# Patient Record
Sex: Male | Born: 1995 | Race: Black or African American | Hispanic: No | Marital: Single | Smoking: Former smoker
Health system: Southern US, Community
[De-identification: ages and names within clinical notes are randomized; demographics above are authoritative.]

## PROBLEM LIST (undated history)

## (undated) ENCOUNTER — Emergency Department: Admission: EM | Payer: PRIVATE HEALTH INSURANCE | Source: Home / Self Care

## (undated) DIAGNOSIS — N200 Calculus of kidney: Secondary | ICD-10-CM

## (undated) DIAGNOSIS — Z720 Tobacco use: Secondary | ICD-10-CM

## (undated) DIAGNOSIS — I301 Infective pericarditis: Secondary | ICD-10-CM

## (undated) DIAGNOSIS — R079 Chest pain, unspecified: Secondary | ICD-10-CM

## (undated) HISTORY — DX: Infective pericarditis: I30.1

## (undated) HISTORY — PX: ANTERIOR CRUCIATE LIGAMENT REPAIR: SHX115

## (undated) HISTORY — DX: Chest pain, unspecified: R07.9

## (undated) HISTORY — DX: Tobacco use: Z72.0

## (undated) HISTORY — DX: Calculus of kidney: N20.0

---
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
## 2014-01-26 ENCOUNTER — Emergency Department: Payer: Self-pay | Admitting: Emergency Medicine

## 2014-06-26 ENCOUNTER — Emergency Department: Payer: Self-pay | Admitting: Emergency Medicine

## 2014-06-26 LAB — COMPREHENSIVE METABOLIC PANEL
Albumin: 3.8 g/dL (ref 3.8–5.6)
Alkaline Phosphatase: 76 U/L
Anion Gap: 5 — ABNORMAL LOW (ref 7–16)
BILIRUBIN TOTAL: 0.3 mg/dL (ref 0.2–1.0)
BUN: 12 mg/dL (ref 7–18)
CALCIUM: 8.8 mg/dL — AB (ref 9.0–10.7)
CO2: 28 mmol/L (ref 21–32)
Chloride: 107 mmol/L (ref 98–107)
Creatinine: 1.37 mg/dL — ABNORMAL HIGH (ref 0.60–1.30)
EGFR (Non-African Amer.): >60
Glucose: 101 mg/dL — ABNORMAL HIGH (ref 65–99)
OSMOLALITY: 279 (ref 275–301)
Potassium: 4.2 mmol/L (ref 3.5–5.1)
SGOT(AST): 82 U/L — ABNORMAL HIGH (ref 10–41)
SGPT (ALT): 33 U/L (ref 12–78)
SODIUM: 140 mmol/L (ref 136–145)
Total Protein: 6.8 g/dL (ref 6.4–8.6)

## 2014-06-26 LAB — URINALYSIS, COMPLETE
BACTERIA: NONE SEEN
Bilirubin,UR: NEGATIVE
GLUCOSE, UR: NEGATIVE mg/dL (ref 0–75)
Ketone: NEGATIVE
Nitrite: NEGATIVE
PH: 5 (ref 4.5–8.0)
Protein: NEGATIVE
Specific Gravity: 1.019 (ref 1.003–1.030)
WBC UR: 96 /HPF (ref 0–5)

## 2014-06-26 LAB — CBC
HCT: 43 % (ref 40.0–52.0)
HGB: 14.7 g/dL (ref 13.0–18.0)
MCH: 31.7 pg (ref 26.0–34.0)
MCHC: 34.2 g/dL (ref 32.0–36.0)
MCV: 93 fL (ref 80–100)
PLATELETS: 232 10*3/uL (ref 150–440)
RBC: 4.63 10*6/uL (ref 4.40–5.90)
RDW: 13.2 % (ref 11.5–14.5)
WBC: 7.8 10*3/uL (ref 3.8–10.6)

## 2014-06-28 LAB — URINE CULTURE

## 2016-05-17 ENCOUNTER — Other Ambulatory Visit
Admission: RE | Admit: 2016-05-17 | Payer: Worker's Compensation | Source: Other Acute Inpatient Hospital | Admitting: Family Medicine

## 2016-06-21 ENCOUNTER — Encounter: Payer: Self-pay | Admitting: Emergency Medicine

## 2016-06-21 ENCOUNTER — Emergency Department
Admission: EM | Admit: 2016-06-21 | Discharge: 2016-06-21 | Disposition: A | Discharge status: Home / Self Care | Payer: Self-pay | Attending: Emergency Medicine | Admitting: Emergency Medicine

## 2016-06-21 DIAGNOSIS — J988 Other specified respiratory disorders: Secondary | ICD-10-CM

## 2016-06-21 DIAGNOSIS — R300 Dysuria: Secondary | ICD-10-CM | POA: Insufficient documentation

## 2016-06-21 DIAGNOSIS — B9789 Other viral agents as the cause of diseases classified elsewhere: Secondary | ICD-10-CM

## 2016-06-21 DIAGNOSIS — J069 Acute upper respiratory infection, unspecified: Secondary | ICD-10-CM | POA: Insufficient documentation

## 2016-06-21 LAB — CHLAMYDIA/NGC RT PCR (ARMC ONLY)
CHLAMYDIA TR: NOT DETECTED
N gonorrhoeae: NOT DETECTED

## 2016-06-21 LAB — URINALYSIS COMPLETE WITH MICROSCOPIC (ARMC ONLY)
BACTERIA UA: NONE SEEN
Bilirubin Urine: NEGATIVE
GLUCOSE, UA: NEGATIVE mg/dL
HGB URINE DIPSTICK: NEGATIVE
Ketones, ur: NEGATIVE mg/dL
LEUKOCYTES UA: NEGATIVE
NITRITE: NEGATIVE
PROTEIN: NEGATIVE mg/dL
RBC / HPF: NONE SEEN RBC/hpf (ref 0–5)
SPECIFIC GRAVITY, URINE: 1.019 (ref 1.005–1.030)
pH: 6 (ref 5.0–8.0)

## 2016-06-21 MED ORDER — PSEUDOEPH-BROMPHEN-DM 30-2-10 MG/5ML PO SYRP: 5.0000 mL | ORAL_SOLUTION | Freq: Three times a day (TID) | ORAL | Status: DC | PRN

## 2016-06-21 NOTE — ED Provider Notes (Signed)
CSN: [id number]     Arrival date & time 06/21/16  1809 History   First MD Initiated Contact with Patient 06/21/16 1849     Chief Complaint  Patient presents with  . Otalgia     (Consider location/radiation/quality/duration/timing/severity/associated sxs/prior Treatment) HPI  20 year old male presents to emergency department for evaluation of right ear pain 1 week. Pain initially improved from last 2 days has increased. Patient is taking ibuprofen without improvement. He complains of associated sore throat, cough, congestion. He denies any fevers, difficulty swallowing, chest pain, shortness of breath, abdominal pain, nausea vomiting or skin rashes. Patient also complains of dysuria that has been present for 7 days. He denies any testicular pain or penile discharge. He is concerned he may have a STD, has had numerous sexual partners of the last month. No known exposure to STD.   No past medical history on file. History reviewed. No pertinent past surgical history. No family history on file. Social History  Substance Use Topics  . Smoking status: Never Smoker   . Smokeless tobacco: None  . Alcohol Use: None    Review of Systems  Constitutional: Negative.  Negative for fever, chills, activity change and appetite change.  HENT: Positive for congestion, ear pain, rhinorrhea and sinus pressure. Negative for ear discharge, mouth sores, sore throat and trouble swallowing.   Eyes: Negative for photophobia, pain and discharge.  Respiratory: Negative for cough, chest tightness and shortness of breath.   Cardiovascular: Negative for chest pain and leg swelling.  Gastrointestinal: Negative for nausea, vomiting, abdominal pain, diarrhea and abdominal distention.  Genitourinary: Positive for dysuria. Negative for urgency, decreased urine volume, discharge, scrotal swelling, difficulty urinating, penile pain and testicular pain.  Musculoskeletal: Negative for back pain, arthralgias and gait problem.   Skin: Negative for color change and rash.  Neurological: Negative for dizziness and headaches.  Hematological: Negative for adenopathy.  Psychiatric/Behavioral: Negative for behavioral problems and agitation.      Allergies  Review of patient's allergies indicates no known allergies.  Home Medications   Prior to Admission medications   Medication Sig Start Date End Date Taking? Authorizing Provider  brompheniramine-pseudoephedrine-DM 30-2-10 MG/5ML syrup Take 5 mLs by mouth 3 (three) times daily as needed. 06/21/16   Evon Slackhomas C Gaines, PA-C   BP 127/64 mmHg  Pulse 64  Temp(Src) 98.5 F (36.9 C) (Oral)  Resp 15  Ht 6\' 2"  (1.88 m)  Wt 99.791 kg  BMI 28.23 kg/m2  SpO2 100% Physical Exam  Constitutional: He is oriented to person, place, and time. He appears well-developed and well-nourished.  HENT:  Head: Normocephalic and atraumatic.  Right Ear: External ear normal.  Left Ear: External ear normal.  Nose: Nose normal.  Mouth/Throat: Oropharynx is clear and moist. No oropharyngeal exudate.  Eyes: Conjunctivae and EOM are normal. Pupils are equal, round, and reactive to light.  Neck: Normal range of motion. Neck supple.  Cardiovascular: Normal rate, regular rhythm, normal heart sounds and intact distal pulses.   Pulmonary/Chest: Effort normal and breath sounds normal. No respiratory distress. He has no wheezes. He has no rales. He exhibits no tenderness.  Abdominal: Soft. Bowel sounds are normal. He exhibits no distension. There is no tenderness.  Genitourinary: Right testis shows no mass, no swelling and no tenderness. Right testis is descended. Left testis shows no mass, no swelling and no tenderness. Left testis is descended. Uncircumcised. No penile erythema. No discharge found.  Musculoskeletal: Normal range of motion. He exhibits no edema or tenderness.  Lymphadenopathy:  He has cervical adenopathy (mild posterior cervical lymphadenopathy).  Neurological: He is alert and  oriented to person, place, and time.  Skin: Skin is warm and dry.  Psychiatric: He has a normal mood and affect. His behavior is normal. Judgment and thought content normal.    ED Course  Procedures (including critical care time) Labs Review Labs Reviewed  URINALYSIS COMPLETEWITH MICROSCOPIC ([facility] ONLY) - Abnormal; Notable for the following:    Color, Urine YELLOW (*)    APPearance CLEAR (*)    Squamous Epithelial / LPF 0-5 (*)    All other components within normal limits  CHLAMYDIA/NGC RT PCR [facility] ONLY)    Imaging Review No results found. I have personally reviewed and evaluated these images and lab results as part of my medical decision-making.   EKG Interpretation None      MDM   Final diagnoses:  Viral respiratory illness  Dysuria    20 year old male with right ear pain, mild cold symptoms. Vital signs are normal. Patient's given Bromfed-DM medication with antihistamine and decongestant to help with congestion. Patient also with dysuria, no penile discharge, penile and scrotal exam normal. Urinalysis normal. Patient is tested for STD, labs have not resulted, if positive patient will need to come back for treatment.    Evon Slackhomas C Gaines, PA-C 06/21/16 2016  Loleta Roseory Forbach, MD 06/21/16 2229

## 2016-06-21 NOTE — Discharge Instructions (Signed)
Dysuria Dysuria is pain or discomfort while urinating. The pain or discomfort may be felt in the tube that carries urine out of the bladder (urethra) or in the surrounding tissue of the genitals. The pain may also be felt in the groin area, lower abdomen, and lower back. You may have to urinate frequently or have the sudden feeling that you have to urinate (urgency). Dysuria can affect both men and women, but is more common in women. Dysuria can be caused by many different things, including:  Urinary tract infection in women.  Infection of the kidney or bladder.  Kidney stones or bladder stones.  Certain sexually transmitted infections (STIs), such as chlamydia.  Dehydration.  Inflammation of the vagina.  Use of certain medicines.  Use of certain soaps or scented products that cause irritation. HOME CARE INSTRUCTIONS Watch your dysuria for any changes. The following actions may help to reduce any discomfort you are feeling:  Drink enough fluid to keep your urine clear or pale yellow.  Empty your bladder often. Avoid holding urine for long periods of time.  After a bowel movement or urination, women should cleanse from front to back, using each tissue only once.  Empty your bladder after sexual intercourse.  Take medicines only as directed by your health care provider.  If you were prescribed an antibiotic medicine, finish it all even if you start to feel better.  Avoid caffeine, tea, and alcohol. They can irritate the bladder and make dysuria worse. In men, alcohol may irritate the prostate.  Keep all follow-up visits as directed by your health care provider. This is important.  If you had any tests done to find the cause of dysuria, it is your responsibility to obtain your test results. Ask the lab or department performing the test when and how you will get your results. Talk with your health care provider if you have any questions about your results. SEEK MEDICAL CARE  IF:  You develop pain in your back or sides.  You have a fever.  You have nausea or vomiting.  You have blood in your urine.  You are not urinating as often as you usually do. SEEK IMMEDIATE MEDICAL CARE IF:  You pain is severe and not relieved with medicines.  You are unable to hold down any fluids.  You or someone else notices a change in your mental function.  You have a rapid heartbeat at rest.  You have shaking or chills.  You feel extremely weak.   This information is not intended to replace advice given to you by your health care provider. Make sure you discuss any questions you have with your health care provider.   Document Released: 09/14/2004 Document Revised: 01/07/2015 Document Reviewed: 08/12/2014 Elsevier Interactive Patient Education 2016 Elsevier Inc.  Viral Infections A virus is a type of germ. Viruses can cause:  Minor sore throats.  Aches and pains.  Headaches.  Runny nose.  Rashes.  Watery eyes.  Tiredness.  Coughs.  Loss of appetite.  Feeling sick to your stomach (nausea).  Throwing up (vomiting).  Watery poop (diarrhea). HOME CARE   Only take medicines as told by your doctor.  Drink enough water and fluids to keep your pee (urine) clear or pale yellow. Sports drinks are a good choice.  Get plenty of rest and eat healthy. Soups and broths with crackers or rice are fine. GET HELP RIGHT AWAY IF:   You have a very bad headache.  You have shortness of breath.  You have chest pain or neck pain.  You have an unusual rash.  You cannot stop throwing up.  You have watery poop that does not stop.  You cannot keep fluids down.  You or your child has a temperature by mouth above 102 F (38.9 C), not controlled by medicine.  Your baby is older than 3 months with a rectal temperature of 102 F (38.9 C) or higher.  Your baby is 193 months old or younger with a rectal temperature of 100.4 F (38 C) or higher. MAKE SURE YOU:    Understand these instructions.  Will watch this condition.  Will get help right away if you are not doing well or get worse.   This information is not intended to replace advice given to you by your health care provider. Make sure you discuss any questions you have with your health care provider.   Document Released: 11/29/2008 Document Revised: 03/10/2012 Document Reviewed: 05/25/2015 Elsevier Interactive Patient Education [employer]2016 Elsevier Inc.

## 2016-06-21 NOTE — ED Notes (Signed)
C/o right ear pain x 1 week 

## 2016-06-21 NOTE — ED Notes (Signed)
Discharge instructions reviewed with patient. Patient verbalized understanding. Patient ambulated to lobby without difficulty.   

## 2016-06-28 ENCOUNTER — Emergency Department (HOSPITAL_COMMUNITY)
Admission: EM | Admit: 2016-06-28 | Discharge: 2016-06-28 | Disposition: A | Discharge status: Home / Self Care | Payer: 59 | Attending: Emergency Medicine | Admitting: Emergency Medicine

## 2016-06-28 ENCOUNTER — Encounter (HOSPITAL_COMMUNITY): Payer: Self-pay | Admitting: Emergency Medicine

## 2016-06-28 DIAGNOSIS — F17210 Nicotine dependence, cigarettes, uncomplicated: Secondary | ICD-10-CM | POA: Insufficient documentation

## 2016-06-28 DIAGNOSIS — J028 Acute pharyngitis due to other specified organisms: Secondary | ICD-10-CM | POA: Diagnosis not present

## 2016-06-28 DIAGNOSIS — J029 Acute pharyngitis, unspecified: Secondary | ICD-10-CM

## 2016-06-28 DIAGNOSIS — B9789 Other viral agents as the cause of diseases classified elsewhere: Secondary | ICD-10-CM | POA: Diagnosis not present

## 2016-06-28 LAB — RAPID STREP SCREEN (MED CTR MEBANE ONLY): STREPTOCOCCUS, GROUP A SCREEN (DIRECT): NEGATIVE

## 2016-06-28 NOTE — ED Provider Notes (Signed)
CSN: [id number]     Arrival date & time 06/28/16  [id number] History  By signing my name below, I, Tony Pitts, attest that this documentation has been prepared under the direction and in the presence of [name], PA-C. Electronically Signed: Doreatha MartinEva Pitts, ED Scribe. 06/28/2016. 10:27 AM.     Chief Complaint  Patient presents with  . Sore Throat   The history is provided by the patient. No language interpreter was used.   HPI Comments: Tony Pitts is a 20 y.o. male who presents to the Emergency Department complaining of moderate left-sided sore throat onset onset 4 days ago with associated generalized myalgias, rhinorrhea, congestion. He reports his pain began initially last week with onset of ear pain, for which he was treated with Bromfed-DM. He reports that his ear pain has resolved after this treatment, but his sore throat has persisted. Per pt, he has tried Chloraseptic spray and throat lozenges with no relief of pain. Pt states that pain is worsened with swallowing. Pt reports adequate food and fluid intake. No known sick contacts with similar symptoms. Denies cough, fever, chills, nausea, emesis, diarrhea, difficulty tolerating secretions or swallowing.    History reviewed. No pertinent past medical history. History reviewed. No pertinent past surgical history. No family history on file. Social History  Substance Use Topics  . Smoking status: Current Every Day Smoker -- 0.10 packs/day    Types: Cigarettes  . Smokeless tobacco: None  . Alcohol Use: Yes     Comment: socially    Review of Systems  Constitutional: Negative for fever and chills.  HENT: Positive for congestion, rhinorrhea and sore throat. Negative for trouble swallowing.   Respiratory: Negative for cough.   Gastrointestinal: Negative for nausea, vomiting and diarrhea.  Musculoskeletal: Positive for myalgias (generalized).   Allergies  Review of patient's allergies indicates no known allergies.  Home  Medications   Prior to Admission medications   Medication Sig Start Date End Date Taking? Authorizing Provider  brompheniramine-pseudoephedrine-DM 30-2-10 MG/5ML syrup Take 5 mLs by mouth 3 (three) times daily as needed. 06/21/16   Evon Slackhomas C Gaines, PA-C   BP 128/67 mmHg  Pulse 68  Temp(Src) 98.4 F (36.9 C) (Oral)  Resp 16  Ht 6\' 2"  (1.88 m)  Wt 235 lb (106.595 kg)  BMI 30.16 kg/m2  SpO2 100% Physical Exam  Physical Exam  Constitutional: Pt  is oriented to person, place, and time. Appears well-developed and well-nourished. No distress.  HENT:  Head: Normocephalic and atraumatic.  Right Ear: Tympanic membrane, external ear and ear canal normal.  Left Ear: Tympanic membrane, external ear and ear canal normal.  Nose: No epistaxis. Right sinus exhibits no maxillary sinus tenderness and no frontal sinus tenderness. Left sinus exhibits no maxillary sinus tenderness and no frontal sinus tenderness.  Mouth/Throat: Uvula is midline and mucous membranes are normal. Mucous membranes are not pale and not cyanotic. No tonsillar abscesses. Moderate posterior oropharyngeal edema with mild exudate.  Eyes: Conjunctivae are normal. Pupils are equal, round, and reactive to light.  Neck: Normal range of motion and full passive range of motion without pain.  Cardiovascular: Normal rate and intact distal pulses.   Pulmonary/Chest: Effort normal and breath sounds normal. No stridor.  Clear and equal breath sounds without focal wheezes, rhonchi, rales  Abdominal: Soft. Bowel sounds are normal. There is no tenderness.  Musculoskeletal: Normal range of motion.  Lymphadenopathy:    Pthas no cervical adenopathy.  Neurological: Pt is alert and oriented to person, place, and  time.  Skin: Skin is warm and dry. No rash noted. Pt is not diaphoretic.  Psychiatric: Normal mood and affect.  Nursing note and vitals reviewed.   ED Course  Procedures (including critical care time) DIAGNOSTIC STUDIES: Oxygen  Saturation is 100% on RA, normal by my interpretation.    COORDINATION OF CARE: 10:24 AM Discussed treatment plan with pt at bedside which includes rapid strep and pt agreed to plan. Discussed conservative therapies if rapid strep is negative, including alternating tylenol and ibuprofen, increased fluid intake.    Labs Review Labs Reviewed  RAPID STREP SCREEN (NOT AT [facility])  CULTURE, GROUP A STREP [facility])    I have personally reviewed and evaluated these lab results as part of my medical decision-making.   MDM   Final diagnoses:  Viral pharyngitis    Pt is requesting to be d/c without results, and asks that he can be notified by telephone if he needs an antibiotic. We will f/u on rapid strep test and notify him if positive.   Step test is negative.  I personally performed the services described in this documentation, which was scribed in my presence. The recorded information has been reviewed and is accurate.     Roxy HorsemanRobert Insiya Oshea, PA-C 06/28/16 1106  Marily MemosJason Mesner, MD 06/28/16 [id number]

## 2016-06-28 NOTE — Discharge Instructions (Signed)

## 2016-06-28 NOTE — ED Notes (Signed)
Patient states sore throat x several days.   Patient states has been using chloraseptic spray and throat lozenges at home with no relief.   Denies other symptoms.

## 2016-06-29 LAB — CULTURE, GROUP A STREP (THRC)

## 2016-08-22 ENCOUNTER — Emergency Department (HOSPITAL_COMMUNITY)
Admission: EM | Admit: 2016-08-22 | Discharge: 2016-08-22 | Disposition: A | Discharge status: Home / Self Care | Payer: 59 | Attending: Emergency Medicine | Admitting: Emergency Medicine

## 2016-08-22 ENCOUNTER — Encounter (HOSPITAL_COMMUNITY): Payer: Self-pay | Admitting: Emergency Medicine

## 2016-08-22 DIAGNOSIS — L709 Acne, unspecified: Secondary | ICD-10-CM | POA: Diagnosis not present

## 2016-08-22 DIAGNOSIS — R21 Rash and other nonspecific skin eruption: Secondary | ICD-10-CM | POA: Diagnosis present

## 2016-08-22 DIAGNOSIS — F17210 Nicotine dependence, cigarettes, uncomplicated: Secondary | ICD-10-CM | POA: Insufficient documentation

## 2016-08-22 MED ORDER — SULFACETAMIDE SODIUM-SULFUR 10-5 % EX CREA: TOPICAL_CREAM | CUTANEOUS | 0 refills | Status: DC

## 2016-08-22 NOTE — ED Triage Notes (Signed)
Patient complaining of rash around mouth area x 2 months. Denies pain or itching.

## 2016-08-24 NOTE — ED Provider Notes (Signed)
[facility]-EMERGENCY DEPT Provider Note   CSN: [id number] Arrival date & time: 08/22/16  1534     History   Chief Complaint Chief Complaint  Patient presents with  . Rash    HPI Tony Pitts is a 20 y.o. male presenting with a rash which has been present around his mouth for the past 2 months.  He describes tiny white topped "bumps" which are neither painful or itchy.  He denies drainage from the sites but does state they seem to be spreading as he noticed a few new ones around the upper left lip border.  He has had no treatments prior to arrival for this problem.  The history is provided by the patient.    History reviewed. No pertinent past medical history.  There are no active problems to display for this patient.   Past Surgical History:  Procedure Laterality Date  . ANTERIOR CRUCIATE LIGAMENT REPAIR         Home Medications    Prior to Admission medications   Medication Sig Start Date End Date Taking? Authorizing Provider  acetaminophen (TYLENOL) 500 MG tablet Take 1,000 mg by mouth every 6 (six) hours as needed.   Yes Historical Provider, MD  brompheniramine-pseudoephedrine-DM 30-2-10 MG/5ML syrup Take 5 mLs by mouth 3 (three) times daily as needed. Patient not taking: Reported on 08/22/2016 06/21/16   Evon Slack, PA-C  Sulfacetamide Sodium-Sulfur 10-5 % CREA Apply small amount to affected area twice daily, rubbing in until absorbed 08/22/16   Burgess Amor, PA-C    Family History History reviewed. No pertinent family history.  Social History Social History  Substance Use Topics  . Smoking status: Current Every Day Smoker    Packs/day: 0.10    Types: Cigarettes  . Smokeless tobacco: Never Used  . Alcohol use Yes     Comment: socially     Allergies   Review of patient's allergies indicates no known allergies.   Review of Systems Review of Systems  Constitutional: Negative for chills and fever.  Respiratory: Negative for shortness of breath and  wheezing.   Skin: Positive for rash.  Neurological: Negative for numbness.     Physical Exam Updated Vital Signs BP 127/66 (BP Location: Left Arm)   Pulse 80   Temp 98.2 F (36.8 C) (Oral)   Resp 14   Ht 6\' 2"  (1.88 m)   Wt 89.8 kg   SpO2 100%   BMI 25.42 kg/m   Physical Exam  Constitutional: He appears well-developed and well-nourished. No distress.  HENT:  Head: Normocephalic.  Neck: Neck supple.  Cardiovascular: Normal rate.   Pulmonary/Chest: Effort normal. He has no wheezes.  Musculoskeletal: Normal range of motion. He exhibits no edema.  Skin: Rash noted.  Pt has tiny raised white centered papules on left chin and left lip border.  No edema or surrounding erythema.  Dry.      ED Treatments / Results  Labs (all labs ordered are listed, but only abnormal results are displayed) Labs Reviewed - No data to display  EKG  EKG Interpretation None       Radiology No results found.  Procedures Procedures (including critical care time)  Medications Ordered in ED Medications - No data to display   Initial Impression / Assessment and Plan / ED Course  I have reviewed the triage vital signs and the nursing notes.  Pertinent labs & imaging results that were available during my care of the patient were reviewed by me and considered in  my medical decision making (see chart for details).  Clinical Course    Exam c/w blocked pores/small comedones without infection.  Advised sulfacetamide cream bid. Referral to dermatology for recheck if not improving after 10 days use of this medicine.   Final Clinical Impressions(s) / ED Diagnoses   Final diagnoses:  Acne, unspecified acne type    New Prescriptions Discharge Medication List as of 08/22/2016  4:42 PM    START taking these medications   Details  Sulfacetamide Sodium-Sulfur 10-5 % CREA Apply small amount to affected area twice daily, rubbing in until absorbed, Print         Burgess AmorJulie Syd Newsome, PA-C 08/24/16  1416    Vanetta MuldersScott Zackowski, MD 08/29/16 [phone]

## 2016-09-16 ENCOUNTER — Encounter (HOSPITAL_COMMUNITY): Payer: Self-pay | Admitting: *Deleted

## 2016-09-16 ENCOUNTER — Emergency Department (HOSPITAL_COMMUNITY): Payer: 59

## 2016-09-16 ENCOUNTER — Emergency Department (HOSPITAL_COMMUNITY)
Admission: EM | Admit: 2016-09-16 | Discharge: 2016-09-16 | Disposition: A | Discharge status: Home / Self Care | Payer: 59 | Attending: Emergency Medicine | Admitting: Emergency Medicine

## 2016-09-16 DIAGNOSIS — B9789 Other viral agents as the cause of diseases classified elsewhere: Secondary | ICD-10-CM

## 2016-09-16 DIAGNOSIS — J069 Acute upper respiratory infection, unspecified: Secondary | ICD-10-CM | POA: Diagnosis not present

## 2016-09-16 DIAGNOSIS — F17210 Nicotine dependence, cigarettes, uncomplicated: Secondary | ICD-10-CM | POA: Diagnosis not present

## 2016-09-16 DIAGNOSIS — R079 Chest pain, unspecified: Secondary | ICD-10-CM | POA: Diagnosis present

## 2016-09-16 LAB — I-STAT CHEM 8, ED
BUN: 15 mg/dL (ref 6–20)
Calcium, Ion: 1.14 mmol/L — ABNORMAL LOW (ref 1.15–1.40)
Chloride: 102 mmol/L (ref 101–111)
Creatinine, Ser: 1.4 mg/dL — ABNORMAL HIGH (ref 0.61–1.24)
GLUCOSE: 84 mg/dL (ref 65–99)
HCT: 48 % (ref 39.0–52.0)
HEMOGLOBIN: 16.3 g/dL (ref 13.0–17.0)
POTASSIUM: 4.4 mmol/L (ref 3.5–5.1)
Sodium: 141 mmol/L (ref 135–145)
TCO2: 26 mmol/L (ref 0–100)

## 2016-09-16 LAB — I-STAT TROPONIN, ED: TROPONIN I, POC: 0 ng/mL (ref 0.00–0.08)

## 2016-09-16 MED ORDER — ALBUTEROL SULFATE HFA 108 (90 BASE) MCG/ACT IN AERS
2.0000 | INHALATION_SPRAY | RESPIRATORY_TRACT | Status: DC | PRN
  Administered 2016-09-16: 2 via RESPIRATORY_TRACT
  Filled 2016-09-16: qty 6.7

## 2016-09-16 MED ORDER — AEROCHAMBER PLUS FLO-VU MEDIUM MISC
1.0000 | Freq: Once | Status: AC
  Administered 2016-09-16: 1
  Filled 2016-09-16: qty 1

## 2016-09-16 NOTE — ED Provider Notes (Signed)
[facility]-EMERGENCY DEPT Provider Note   CSN: [id number] Arrival date & time: 09/16/16  0036     History   Chief Complaint Chief Complaint  Patient presents with  . Chest Pain    HPI Tony Pitts is a 20 y.o. male with no major medical problems presents to the Emergency Department complaining of intermittent central chest pain rated at a 3/10 onset 3 days ago.  Pt reports episodes last 3-4 minutes and occur multiple times per day.   He does do heavy lifting at work.  Movement and palpation do not aggravate his pain.  He denies cocaine usage.  He reports he is completely pain free.  Pt denies near syncope, diaphoresis, nausea, vomiting.  He reports 1.5 weeks of chest and head cold symptoms.  He is coughing.  He also reports associated wheezing with his chest pain.  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  He reports he is smoking cigarettes, cigars and marijuana daily.     The history is provided by the patient and medical records. No language interpreter was used.    History reviewed. No pertinent past medical history.  There are no active problems to display for this patient.   Past Surgical History:  Procedure Laterality Date  . ANTERIOR CRUCIATE LIGAMENT REPAIR         Home Medications    Prior to Admission medications   Medication Sig Start Date End Date Taking? Authorizing Provider  acetaminophen (TYLENOL) 500 MG tablet Take 1,000 mg by mouth every 6 (six) hours as needed for mild pain.    Yes Historical Provider, MD    Family History No family history on file.  Social History Social History  Substance Use Topics  . Smoking status: Current Every Day Smoker    Packs/day: 0.10    Types: Cigarettes  . Smokeless tobacco: Never Used  . Alcohol use Yes     Comment: socially     Allergies   Review of patient's allergies indicates no known allergies.   Review of Systems Review of Systems  HENT: Positive for  congestion ( resolved), rhinorrhea ( resolved) and sinus pressure ( minimal). Negative for sore throat.   Respiratory: Positive for cough (mild) and wheezing. Negative for chest tightness and shortness of breath.   Cardiovascular: Positive for chest pain.  All other systems reviewed and are negative.    Physical Exam Updated Vital Signs BP (!) 119/24   Pulse (!) 45   Temp 98.1 F (36.7 C)   Resp 15   Ht 6\' 2"  (1.88 m)   Wt 89.8 kg   SpO2 100%   BMI 25.42 kg/m   Physical Exam  Constitutional: He appears well-developed and well-nourished. No distress.  Awake, alert, nontoxic appearance  HENT:  Head: Normocephalic and atraumatic.  Right Ear: Tympanic membrane, external ear and ear canal normal.  Left Ear: Tympanic membrane, external ear and ear canal normal.  Nose: Mucosal edema present. No rhinorrhea. No epistaxis. Right sinus exhibits no maxillary sinus tenderness and no frontal sinus tenderness. Left sinus exhibits no maxillary sinus tenderness and no frontal sinus tenderness.  Mouth/Throat: Uvula is midline, oropharynx is clear and moist and mucous membranes are normal. Mucous membranes are not pale and not cyanotic. No oropharyngeal exudate, posterior oropharyngeal edema, posterior oropharyngeal erythema or tonsillar abscesses.  Eyes: Conjunctivae are normal. Pupils are equal, round, and reactive to light. No scleral icterus.  Neck: Normal range of motion and full  passive range of motion without pain. Neck supple.  Cardiovascular: Normal rate, regular rhythm and intact distal pulses.   Pulmonary/Chest: Effort normal. No stridor. No respiratory distress. He has decreased breath sounds ( Somewhat). He has no wheezes.  Equal chest expansion  Abdominal: Soft. Bowel sounds are normal. He exhibits no mass. There is no tenderness. There is no rebound and no guarding.  Musculoskeletal: Normal range of motion. He exhibits no edema.  Lymphadenopathy:    He has no cervical adenopathy.    Neurological: He is alert.  Speech is clear and goal oriented Moves extremities without ataxia  Skin: Skin is warm and dry. No rash noted. He is not diaphoretic.  Psychiatric: He has a normal mood and affect.  Nursing note and vitals reviewed.    ED Treatments / Results  Labs (all labs ordered are listed, but only abnormal results are displayed) Labs Reviewed  I-STAT CHEM 8, ED - Abnormal; Notable for the following:       Result Value   Creatinine, Ser 1.40 (*)    Calcium, Ion 1.14 (*)    All other components within normal limits  I-STAT TROPOININ, ED    EKG  EKG Interpretation  Date/Time:  Sunday September 16 2016 00:37:14 EDT Ventricular Rate:  60 PR Interval:  120 QRS Duration: 102 QT Interval:  372 QTC Calculation: 372 R Axis:   82 Text Interpretation:  Sinus rhythm Normal ECG When compared with ECG of 01/26/2014, Incomplete right bundle branch block is no longer Present Confirmed by [facility]  MD, DAVID ([id number]) on 09/16/2016 12:44:36 AM       Radiology Dg Chest 2 View  Result Date: 09/16/2016 CLINICAL DATA:  Cough, chest pain, shortness of breath for 3 days. EXAM: CHEST  2 VIEW COMPARISON:  01/26/2014 FINDINGS: The cardiomediastinal contours are normal. The lungs are clear. Pulmonary vasculature is normal. No consolidation, pleural effusion, or pneumothorax. No acute osseous abnormalities are seen. IMPRESSION: No active cardiopulmonary disease. Electronically Signed   By: Rubye OaksMelanie  Ehinger M.D.   On: 09/16/2016 02:03    Procedures Procedures (including critical care time)  Medications Ordered in ED Medications  albuterol (PROVENTIL HFA;VENTOLIN HFA) 108 (90 Base) MCG/ACT inhaler 2 puff (2 puffs Inhalation Given 09/16/16 0619)  AEROCHAMBER PLUS FLO-VU MEDIUM MISC 1 each (1 each Other Given 09/16/16 [id number])     Initial Impression / Assessment and Plan / ED Course  I have reviewed the triage vital signs and the nursing notes.  Pertinent labs & imaging results that were  available during my care of the patient were reviewed by me and considered in my medical decision making (see chart for details).  Clinical Course  Value Comment By Time   No family hx of sudden cardiac death.   Dahlia ClientHannah Sorren Vallier, PA-C 09/17 0445  EKG 12-Lead NSR, no ischemia Dierdre ForthHannah Keyonda Bickle, PA-C 09/17 0445  DG Chest 2 View No PNA or pneumothroax.   Dahlia ClientHannah Trenesha Alcaide, PA-C 09/17 0445  Creatinine: (!) 1.40 Creatinine baseline Dierdre ForthHannah Jaia Alonge, PA-C 09/17 0554  Hemoglobin: 16.3 No anemia Dierdre ForthHannah Cassandr Cederberg, PA-C 09/17 0554  Troponin i, poc: 0.00 Neg trop Dierdre ForthHannah Naleyah Ohlinger, PA-C 09/17 [id number]   Patient is to be discharged with recommendation to follow up with PCP in regards to today's hospital visit. Chest pain is not likely of cardiac or pulmonary etiology d/t presentation, PERC negative, VSS, no tracheal deviation, no JVD or new murmur, RRR, breath sounds equal bilaterally, EKG without acute abnormalities, negative troponin, and negative CXR. Pt has been advised to  return to the ED if CP becomes exertional, associated with diaphoresis or nausea, radiates to left jaw/arm, worsens or becomes concerning in any way. Pt appears reliable for follow up and is agreeable to discharge.   Final Clinical Impressions(s) / ED Diagnoses   Final diagnoses:  Chest pain, unspecified chest pain type  Viral URI with cough    New Prescriptions Discharge Medication List as of 09/16/2016  5:53 AM       Dierdre Forth, PA-C 09/16/16 1610    Derwood Kaplan, MD 09/16/16 [id number]

## 2016-09-16 NOTE — ED Triage Notes (Signed)
The pt has seen his doctor in the past 3 days for the same complaint

## 2016-09-16 NOTE — Discharge Instructions (Signed)
1. Medications: albuterol MDI, usual home medications 2. Treatment: rest, drink plenty of fluids, take tylenol or ibuprofen for fever control 3. Follow Up: Please followup with your primary doctor in 3 days for discussion of your diagnoses and further evaluation after today's visit; if you do not have a primary care doctor use the resource guide provided to find one; Return to the ER for high fevers, difficulty breathing or other concerning symptoms

## 2016-09-16 NOTE — ED Triage Notes (Signed)
The pt is c/o mid chest pain for 2-3 days  He has been smoking pot suring this time  He is c/o sob  No visible sob evident

## 2016-11-20 ENCOUNTER — Encounter (HOSPITAL_COMMUNITY): Payer: Self-pay | Admitting: Emergency Medicine

## 2016-11-20 ENCOUNTER — Emergency Department (HOSPITAL_COMMUNITY)
Admission: EM | Admit: 2016-11-20 | Discharge: 2016-11-20 | Disposition: A | Discharge status: Home / Self Care | Payer: 59 | Attending: Emergency Medicine | Admitting: Emergency Medicine

## 2016-11-20 DIAGNOSIS — N50812 Left testicular pain: Secondary | ICD-10-CM | POA: Diagnosis not present

## 2016-11-20 DIAGNOSIS — R300 Dysuria: Secondary | ICD-10-CM | POA: Diagnosis present

## 2016-11-20 DIAGNOSIS — F17210 Nicotine dependence, cigarettes, uncomplicated: Secondary | ICD-10-CM | POA: Insufficient documentation

## 2016-11-20 DIAGNOSIS — N50811 Right testicular pain: Secondary | ICD-10-CM | POA: Insufficient documentation

## 2016-11-20 LAB — URINE MICROSCOPIC-ADD ON: RBC / HPF: NONE SEEN RBC/hpf (ref 0–5)

## 2016-11-20 LAB — URINALYSIS, ROUTINE W REFLEX MICROSCOPIC
Bilirubin Urine: NEGATIVE
GLUCOSE, UA: NEGATIVE mg/dL
HGB URINE DIPSTICK: NEGATIVE
KETONES UR: NEGATIVE mg/dL
Nitrite: NEGATIVE
PH: 7 (ref 5.0–8.0)
PROTEIN: NEGATIVE mg/dL
Specific Gravity, Urine: 1.015 (ref 1.005–1.030)

## 2016-11-20 MED ORDER — LIDOCAINE HCL (PF) 1 % IJ SOLN
INTRAMUSCULAR | Status: AC
  Administered 2016-11-20: 5 mL
  Filled 2016-11-20: qty 5

## 2016-11-20 MED ORDER — CEFTRIAXONE SODIUM 250 MG IJ SOLR
250.0000 mg | Freq: Once | INTRAMUSCULAR | Status: AC
  Administered 2016-11-20: 250 mg via INTRAMUSCULAR
  Filled 2016-11-20: qty 250

## 2016-11-20 MED ORDER — AZITHROMYCIN 250 MG PO TABS
1000.0000 mg | ORAL_TABLET | Freq: Once | ORAL | Status: AC
  Administered 2016-11-20: 1000 mg via ORAL
  Filled 2016-11-20: qty 4

## 2016-11-20 NOTE — ED Triage Notes (Signed)
Pt has pain in testicular area, denies injury, no penile discharge, dysuria.  Pt alert and oriented.  Pt has had unprotected sexual intercourse with a new partner.   Pt alert and oriented. Symptoms started 2 weeks ago.

## 2016-11-20 NOTE — ED Provider Notes (Signed)
[facility] Provider Note   CSN: [id number] Arrival date & time: 11/20/16  1407     History   Chief Complaint Chief Complaint  Patient presents with  . Testicle Pain    HPI Tony Pitts is a 20 y.o. male.  HPI   Tony Pitts is a 20 y.o. male who presents to the Emergency Department complaining of burning with urination and bilateral testicular pain, pressure intermittently for 2 weeks.  He reports having unprotected intercourse with a new sexual partner approximately one week prior to onset of symptoms.  He describes a dull pain to both testicles and burning post void.  He denies penile discharge, genital lesions or rash.  He also denies fever, vomiting, abdominal pain, back pain or hematuria   History reviewed. No pertinent past medical history.  There are no active problems to display for this patient.   Past Surgical History:  Procedure Laterality Date  . ANTERIOR CRUCIATE LIGAMENT REPAIR         Home Medications    Prior to Admission medications   Medication Sig Start Date End Date Taking? Authorizing Provider  acetaminophen (TYLENOL) 500 MG tablet Take 1,000 mg by mouth every 6 (six) hours as needed for mild pain.     Historical Provider, MD    Family History History reviewed. No pertinent family history.  Social History Social History  Substance Use Topics  . Smoking status: Current Every Day Smoker    Packs/day: 0.10    Types: Cigarettes  . Smokeless tobacco: Never Used  . Alcohol use Yes     Comment: socially     Allergies   Patient has no known allergies.   Review of Systems Review of Systems  Constitutional: Negative for activity change, appetite change, chills and fever.  Respiratory: Negative for chest tightness and shortness of breath.   Gastrointestinal: Negative for abdominal pain, nausea and vomiting.  Genitourinary: Positive for dysuria, scrotal swelling and testicular pain. Negative for decreased urine volume,  difficulty urinating, discharge, flank pain, frequency, hematuria, penile pain, penile swelling and urgency.  Musculoskeletal: Negative for back pain.  Skin: Negative for rash.  Neurological: Negative for dizziness, weakness and numbness.  Hematological: Negative for adenopathy.  Psychiatric/Behavioral: Negative for confusion.  All other systems reviewed and are negative.    Physical Exam Updated Vital Signs BP 137/72 (BP Location: Left Arm)   Pulse 89   Temp 98.7 F (37.1 C) (Oral)   Resp 18   Ht 6\' 2"  (1.88 m)   Wt 90.7 kg   SpO2 100%   BMI 25.68 kg/m   Physical Exam  Constitutional: He is oriented to person, place, and time. He appears well-developed and well-nourished. No distress.  HENT:  Head: Normocephalic.  Mouth/Throat: Oropharynx is clear and moist.  Cardiovascular: Normal rate and regular rhythm.   Pulmonary/Chest: Effort normal. No respiratory distress.  Abdominal: Soft. He exhibits no distension and no mass. There is no tenderness. There is no guarding. Hernia confirmed negative in the right inguinal area and confirmed negative in the left inguinal area.  Genitourinary: Testes normal. Cremasteric reflex is present. Right testis shows no mass, no swelling and no tenderness. Left testis shows no mass, no swelling and no tenderness. Circumcised. No phimosis or paraphimosis. No discharge found.  Genitourinary Comments: Exam chaperoned by nursing.  No obvious penile discharge,  Testes are non-tender on exam, no erythema, cord non-tender.  No scrotal edema.  No genital lesions  Musculoskeletal: Normal range of motion.  Lymphadenopathy:  No inguinal adenopathy noted on the right or left side.  Neurological: He is alert and oriented to person, place, and time.  Skin: Skin is warm. No rash noted.  Psychiatric: He has a normal mood and affect.  Nursing note and vitals reviewed.    ED Treatments / Results  Labs (all labs ordered are listed, but only abnormal results are  displayed) Labs Reviewed  URINALYSIS, ROUTINE W REFLEX MICROSCOPIC (NOT AT [facility]) - Abnormal; Notable for the following:       Result Value   Leukocytes, UA TRACE (*)    All other components within normal limits  URINE MICROSCOPIC-ADD ON - Abnormal; Notable for the following:    Squamous Epithelial / LPF 0-5 (*)    Bacteria, UA FEW (*)    All other components within normal limits  URINE CULTURE  RPR  HIV ANTIBODY (ROUTINE TESTING)  GC/CHLAMYDIA PROBE AMP ([place]) NOT AT [facility]    EKG  EKG Interpretation None       Radiology No results found.  Procedures Procedures (including critical care time)  Medications Ordered in ED Medications - No data to display   Initial Impression / Assessment and Plan / ED Course  I have reviewed the triage vital signs and the nursing notes.  Pertinent labs & imaging results that were available during my care of the patient were reviewed by me and considered in my medical decision making (see chart for details).  Clinical Course    Pt well appearing.  recent unprotected intercourse with testicular pain and dysuria.  Nml exam of the testicles. I do not feel imaging is warranted at this time. I suspect sx's are  likely related to urethritis and pt treated here with IM rocephin and po zithromax. Return precautions were given.  Questions answered.    The patient appears reasonably screened and/or stabilized for discharge and I doubt any other medical condition or other [facility] requiring further screening, evaluation, or treatment in the ED at this time prior to discharge.    Final Clinical Impressions(s) / ED Diagnoses   Final diagnoses:  Dysuria    New Prescriptions New Prescriptions   No medications on file     Pauline Ausammy Lilyonna Steidle, PA-C 11/21/16 0008    Mancel BaleElliott Wentz, MD 11/21/16 [phone]

## 2016-11-20 NOTE — Discharge Instructions (Signed)
You will be contacted by someone from the hospital if your results are positive and need additional treatment.  Avoid sex for at least one week.  Return here for any worsening symptoms

## 2016-11-21 LAB — GC/CHLAMYDIA PROBE AMP (~~LOC~~) NOT AT ARMC
Chlamydia: NEGATIVE
NEISSERIA GONORRHEA: NEGATIVE

## 2016-11-21 LAB — HIV ANTIBODY (ROUTINE TESTING W REFLEX): HIV SCREEN 4TH GENERATION: NONREACTIVE

## 2016-11-21 LAB — RPR: RPR: NONREACTIVE

## 2016-11-22 LAB — URINE CULTURE: Culture: NO GROWTH

## 2016-12-15 ENCOUNTER — Emergency Department (HOSPITAL_COMMUNITY)
Admission: EM | Admit: 2016-12-15 | Discharge: 2016-12-15 | Disposition: A | Discharge status: Home / Self Care | Payer: 59 | Attending: Emergency Medicine | Admitting: Emergency Medicine

## 2016-12-15 ENCOUNTER — Encounter (HOSPITAL_COMMUNITY): Payer: Self-pay | Admitting: Emergency Medicine

## 2016-12-15 DIAGNOSIS — R21 Rash and other nonspecific skin eruption: Secondary | ICD-10-CM | POA: Diagnosis present

## 2016-12-15 DIAGNOSIS — F17210 Nicotine dependence, cigarettes, uncomplicated: Secondary | ICD-10-CM | POA: Insufficient documentation

## 2016-12-15 DIAGNOSIS — B081 Molluscum contagiosum: Secondary | ICD-10-CM

## 2016-12-15 NOTE — Discharge Instructions (Signed)
Please obtain all of your results from medical records or have your doctors office obtain the results - share them with your doctor - you should be seen at your doctors office in the next 2 days. Call today to arrange your follow up. Take the medications as prescribed. Please review all of the medicines and only take them if you do not have an allergy to them. Please be aware that if you are taking birth control pills, taking other prescriptions, ESPECIALLY ANTIBIOTICS may make the birth control ineffective - if this is the case, either do not engage in sexual activity or use alternative methods of birth control such as condoms until you have finished the medicine and your family doctor says it is OK to restart them. If you are on a blood thinner such as COUMADIN, be aware that any other medicine that you take may cause the coumadin to either work too much, or not enough - you should have your coumadin level rechecked in next 7 days if this is the case.  °?  °It is also a possibility that you have an allergic reaction to any of the medicines that you have been prescribed - Everybody reacts differently to medications and while MOST people have no trouble with most medicines, you may have a reaction such as nausea, vomiting, rash, swelling, shortness of breath. If this is the case, please stop taking the medicine immediately and contact your physician.  °?  °You should return to the ER if you develop severe or worsening symptoms.  ° °[facility] Doctor List ° ° ° °Edward Hawkins MD. Specialty: Pulmonary Disease Contact information: [address]  °[address]  °[place] [address]  °[phone]  ° °Margaret Simpson, MD. Specialty: Family Medicine Contact information: [address]  °[place] [address]  °[phone]  ° °Scott Luking, MD. Specialty: Family Medicine Contact information: [address]  °[address]  °[place] [address]  °[phone]  ° °Tesfaye Fanta, MD Specialty:  Internal Medicine Contact information: [address]  °[place] [address]  °[phone]  ° °Zach Hall, MD. Specialty: Internal Medicine Contact information: [address]  °[place] [address]  °[phone]  ° °Angus Mcinnis, MD. Specialty: Family Medicine Contact information: [address]  °[place]  [place] [address]  °[phone]  ° °Stephen Knowlton, MD. Specialty: Family Medicine Contact information: [address]  °[address]  °[place] [address]  °[phone]  ° °Roy Fagan, MD. Specialty: Internal Medicine Contact information: [address]  °[address]  °[place]  [address]  °[phone]  ° °

## 2016-12-15 NOTE — ED Provider Notes (Signed)
[facility] Provider Note   CSN: [id number] Arrival date & time: 12/15/16  [id number]  By signing my name below, I, Sonum Patel, attest that this documentation has been prepared under the direction and in the presence of Eber HongBrian Bennetta Rudden, MD. Electronically Signed: Sonum Patel, Neurosurgeoncribe. 12/15/16. 12:16 PM.  History   Chief Complaint Chief Complaint  Patient presents with  . Abscess    The history is provided by the patient. No language interpreter was used.     HPI Comments: Tony Pitts is a 20 y.o. male who presents to the Emergency Department complaining of constant lesions below the left lower lip that has been ongoing for the past ~ 6 months. He states the area has become larger and sometimes has associated itching. He denies any other complaints at this time.   History reviewed. No pertinent past medical history.  There are no active problems to display for this patient.   Past Surgical History:  Procedure Laterality Date  . ANTERIOR CRUCIATE LIGAMENT REPAIR         Home Medications    Prior to Admission medications   Not on File    Family History No family history on file.  Social History Social History  Substance Use Topics  . Smoking status: Current Every Day Smoker    Packs/day: 0.10    Types: Cigarettes  . Smokeless tobacco: Never Used  . Alcohol use Yes     Comment: socially     Allergies   Patient has no known allergies.   Review of Systems Review of Systems  Constitutional: Negative for fever.  Skin: Positive for rash.     Physical Exam Updated Vital Signs BP 136/87 (BP Location: Right Arm)   Pulse (!) 56   Temp 98.4 F (36.9 C) (Oral)   Resp 16   Ht 6\' 2"  (1.88 m)   Wt 200 lb (90.7 kg)   SpO2 100%   BMI 25.68 kg/m   Physical Exam  Constitutional: He appears well-developed and well-nourished.  HENT:  Head: Normocephalic and atraumatic.  Eyes: Conjunctivae are normal. Right eye exhibits no discharge. Left eye exhibits  no discharge.  Pulmonary/Chest: Effort normal. No respiratory distress.  Lymphadenopathy:    He has no cervical adenopathy.  Neurological: He is alert. Coordination normal.  Skin: Skin is warm and dry. Rash noted. He is not diaphoretic. No erythema.  3 papular lesions to left face just below to left side of lip  Psychiatric: He has a normal mood and affect.  Nursing note and vitals reviewed.    ED Treatments / Results  DIAGNOSTIC STUDIES: Oxygen Saturation is 100% on RA, normal by my interpretation.    COORDINATION OF CARE: 12:15 PM Discussed treatment plan with pt at bedside and pt agreed to plan.    Labs (all labs ordered are listed, but only abnormal results are displayed) Labs Reviewed - No data to display  Radiology No results found.  Procedures Procedures (including critical care time)  Medications Ordered in ED Medications - No data to display   Initial Impression / Assessment and Plan / ED Course  I have reviewed the triage vital signs and the nursing notes.  Pertinent labs & imaging results that were available during my care of the patient were reviewed by me and considered in my medical decision making (see chart for details).  Clinical Course    Molluscum,  Benign appearance Referred to PCP  Final Clinical Impressions(s) / ED Diagnoses   Final diagnoses:  Molluscum contagiosum    New Prescriptions New Prescriptions   No medications on file   I personally performed the services described in this documentation, which was scribed in my presence. The recorded information has been reviewed and is accurate.      Eber HongBrian Dynastie Knoop, MD 12/15/16 [phone]

## 2016-12-15 NOTE — ED Triage Notes (Signed)
Patient c/o 3 small abscess under lip. Per patient "has been there as long as I can remember but are getting bigger." Patient reports "sqeezing them with purulent drainage but they return." Denies any pain.

## 2016-12-28 ENCOUNTER — Encounter: Payer: Self-pay | Admitting: Family Medicine

## 2016-12-28 ENCOUNTER — Ambulatory Visit (INDEPENDENT_AMBULATORY_CARE_PROVIDER_SITE_OTHER): Payer: 59 | Admitting: Family Medicine

## 2016-12-28 ENCOUNTER — Encounter (INDEPENDENT_AMBULATORY_CARE_PROVIDER_SITE_OTHER): Payer: Self-pay

## 2016-12-28 VITALS — BP 110/66 | HR 74 | Temp 98.2°F | Resp 16 | Ht 74.0 in | Wt 216.0 lb

## 2016-12-28 DIAGNOSIS — Z720 Tobacco use: Secondary | ICD-10-CM

## 2016-12-28 DIAGNOSIS — Z7689 Persons encountering health services in other specified circumstances: Secondary | ICD-10-CM | POA: Diagnosis not present

## 2016-12-28 HISTORY — DX: Tobacco use: Z72.0

## 2016-12-28 NOTE — Progress Notes (Signed)
Chief Complaint  Patient presents with  . Establish Care   Healthy 20 yo No ongoing health issues Refuses flu shot Up to date with immunizations  I have discussed the multiple health risks associated with cigarette smoking including, but not limited to, cardiovascular disease, lung disease and cancer.  I have strongly recommended that smoking be stopped.  I have reviewed the various methods of quitting including cold [place]key, classes, nicotine replacements and prescription medications.  I have offered assistance in this difficult process.  The patient is not interested in assistance at this time. Patient feels he can quit any time   Patient Active Problem List   Diagnosis Date Noted  . Tobacco abuse 12/28/2016    No outpatient encounter prescriptions on file as of 12/28/2016.   No facility-administered encounter medications on file as of 12/28/2016.     Past Medical History:  Diagnosis Date  . Tobacco abuse 12/28/2016    Past Surgical History:  Procedure Laterality Date  . ANTERIOR CRUCIATE LIGAMENT REPAIR      Social History   Social History  . Marital status: Single    Spouse name: N/A  . Number of children: 0  . Years of education: 5713   Occupational History  . warehouse   . Geologist, engineeringstudent     athletic trainer   Social History Main Topics  . Smoking status: Current Every Day Smoker    Packs/day: 0.10    Types: Cigarettes  . Smokeless tobacco: Never Used  . Alcohol use Yes     Comment: socially  . Drug use:     Types: Marijuana  . Sexual activity: Yes    Birth control/ protection: Condom   Other Topics Concern  . 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 Cancer Mother     breast  . Asthma Sister     Review of Systems  Constitutional: Negative for chills, fever and weight loss.  HENT: Negative  for congestion and hearing loss.   Eyes: Negative for blurred vision and pain.  Respiratory: Negative for cough and shortness of breath.   Cardiovascular: Negative for chest pain and leg swelling.  Gastrointestinal: Negative for abdominal pain, constipation, diarrhea and heartburn.  Genitourinary: Negative for dysuria and frequency.  Musculoskeletal: Negative for falls, joint pain and myalgias.  Neurological: Negative for dizziness, seizures and headaches.  Psychiatric/Behavioral: Negative for depression. The patient is not nervous/anxious and does not have insomnia.     BP 110/66 (BP Location: Right Arm, Patient Position: Sitting, Cuff Size: Large)   Pulse 74   Temp 98.2 F (36.8 C) (Oral)   Resp 16   Ht 6\' 2"  (1.88 m)   Wt 216 lb 0.6 oz (98 kg)   SpO2 100%   BMI 27.74 kg/m   Physical Exam BP 110/66 (BP Location: Right Arm, Patient Position: Sitting, Cuff Size: Large)   Pulse 74   Temp 98.2 F (36.8 C) (Oral)   Resp 16   Ht 6\' 2"  (1.88 m)   Wt 216 lb 0.6 oz (98 kg)   SpO2 100%   BMI 27.74 kg/m   General Appearance:    Alert, cooperative, no distress, appears stated age  Head:    Normocephalic, without obvious abnormality, atraumatic  Eyes:    PERRL, conjunctiva/corneas clear, EOM's intact,  fundi    benign, both eyes       Ears:    Normal TM's and external ear canals, both ears  Nose:   Nares normal, septum midline, mucosa normal, no drainage   or sinus tenderness  Throat:   Lips, mucosa, and tongue normal; teeth and gums normal  Neck:   Supple, symmetrical, trachea midline, no adenopathy;       thyroid:  No enlargement/tenderness/nodule  Lungs:     Clear to auscultation bilaterally, respirations unlabored  Chest wall:    No tenderness or deformity  Heart:    Regular rate and rhythm, S1 and S2 normal, no murmur, rub   or gallop  Abdomen:     Soft, non-tender, bowel sounds active all four quadrants,    no masses, no organomegaly  Extremities:   Extremities normal,  atraumatic, no cyanosis or edema  Pulses:   2+ and symmetric all extremities  Skin:   Skin color, texture, turgor normal, no rashes or lesions  Lymph nodes:   Cervical, supraclavicular, and axillary nodes normal  Neurologic:   Normal strength, sensation and reflexes      throughout  ASSESSMENT/PLAN:   1. Tobacco abuse  2. New to Establish  Patient Instructions  Consider yearly flu shots  See me once a year  Call or come in sooner for any problems   Eustace MooreYvonne Sue Rohaan Durnil, MD

## 2016-12-28 NOTE — Patient Instructions (Signed)
Consider yearly flu shots  See me once a year  Call or come in sooner for any problems

## 2017-03-09 ENCOUNTER — Emergency Department
Admission: EM | Admit: 2017-03-09 | Discharge: 2017-03-09 | Disposition: A | Discharge status: Home / Self Care | Payer: 59 | Attending: Emergency Medicine | Admitting: Emergency Medicine

## 2017-03-09 ENCOUNTER — Encounter: Payer: Self-pay | Admitting: Emergency Medicine

## 2017-03-09 DIAGNOSIS — F17210 Nicotine dependence, cigarettes, uncomplicated: Secondary | ICD-10-CM | POA: Insufficient documentation

## 2017-03-09 DIAGNOSIS — Z711 Person with feared health complaint in whom no diagnosis is made: Secondary | ICD-10-CM | POA: Diagnosis not present

## 2017-03-09 DIAGNOSIS — R109 Unspecified abdominal pain: Secondary | ICD-10-CM | POA: Diagnosis present

## 2017-03-09 DIAGNOSIS — Z87448 Personal history of other diseases of urinary system: Secondary | ICD-10-CM | POA: Diagnosis not present

## 2017-03-09 LAB — URINALYSIS, COMPLETE (UACMP) WITH MICROSCOPIC
Bacteria, UA: NONE SEEN
Bilirubin Urine: NEGATIVE
GLUCOSE, UA: NEGATIVE mg/dL
Hgb urine dipstick: NEGATIVE
KETONES UR: NEGATIVE mg/dL
LEUKOCYTES UA: NEGATIVE
Nitrite: NEGATIVE
PH: 6 (ref 5.0–8.0)
Protein, ur: NEGATIVE mg/dL
RBC / HPF: NONE SEEN RBC/hpf (ref 0–5)
SPECIFIC GRAVITY, URINE: 1.018 (ref 1.005–1.030)
SQUAMOUS EPITHELIAL / LPF: NONE SEEN

## 2017-03-09 MED ORDER — ACETAMINOPHEN 500 MG PO TABS: 1000.0000 mg | ORAL_TABLET | Freq: Once | ORAL | Status: DC

## 2017-03-09 NOTE — ED Provider Notes (Signed)
[facility] Emergency Department Provider Note  ____________________________________________   First MD Initiated Contact with Patient 03/09/17 [phone]     (approximate)  I have reviewed the triage vital signs and the nursing notes.   HISTORY  Chief Complaint Hematuria    HPI Tony Pitts is a 21 y.o. male chief complaint of this spell hematuria and right flank pain last night. Patient denies any prior urinary tract infections or kidney stones. He states there was a pressure with voiding that he has not experienced since. He denies any nausea or vomiting. There is been no fever or chills. Patient denies any pain at this time. He has not taken any over-the-counter medication prior to his arrival in the emergency room. He denies any injuries to his back and has had no difficulty walking. Patient rates his pain as 0/10 at this time. He also states he needs a note to return to work as he did not go today.   Past Medical History:  Diagnosis Date  . Tobacco abuse 12/28/2016    Patient Active Problem List   Diagnosis Date Noted  . Tobacco abuse 12/28/2016    Past Surgical History:  Procedure Laterality Date  . ANTERIOR CRUCIATE LIGAMENT REPAIR      Prior to Admission medications   Not on File    Allergies Patient has no known allergies.  Family History  Problem Relation Age of Onset  . Cancer Mother     breast  . Asthma Sister     Social History Social History  Substance Use Topics  . Smoking status: Current Every Day Smoker    Packs/day: 0.10    Types: Cigarettes  . Smokeless tobacco: Never Used  . Alcohol use Yes     Comment: socially    Review of Systems Constitutional: No fever/chills Cardiovascular: Denies chest pain. Respiratory: Denies shortness of breath. Gastrointestinal:   No nausea, no vomiting.   Genitourinary: Positive for hematuria yesterday. Musculoskeletal: Positive for right flank pain. Skin: Negative for  rash. Neurological: Negative for headaches, focal weakness or numbness.  10-point ROS otherwise negative.  ____________________________________________   PHYSICAL EXAM:  VITAL SIGNS: ED Triage Vitals  Enc Vitals Group     BP 03/09/17 0853 128/71     Pulse Rate 03/09/17 0853 61     Resp 03/09/17 0853 20     Temp 03/09/17 0853 98.2 F (36.8 C)     Temp Source 03/09/17 0853 Oral     SpO2 03/09/17 0853 100 %     Weight 03/09/17 0854 216 lb (98 kg)     Height --      Head Circumference --      Peak Flow --      Pain Score 03/09/17 0854 0     Pain Loc --      Pain Edu? --      Excl. in GC? --     Constitutional: Alert and oriented. Well appearing and in no acute distress. Eyes: Conjunctivae are normal. PERRL. EOMI. Head: Atraumatic. Nose: No congestion/rhinnorhea. Neck: No stridor.   Cardiovascular: Normal rate, regular rhythm. Grossly normal heart sounds.  Good peripheral circulation. Respiratory: Normal respiratory effort.  No retractions. Lungs CTAB. Gastrointestinal: Soft and nontender. No distention.  No CVA tenderness. Musculoskeletal: Moves upper and lower extremities without difficulty. Normal gait was noted. Patient is lying on his stomach and does not appear to be in any acute distress. There is no difficulty with movement. Neurologic:  Normal speech and  language. No gross focal neurologic deficits are appreciated. No gait instability. Skin:  Skin is warm, dry and intact. No rash noted. Psychiatric: Mood and affect are normal. Speech and behavior are normal.  ____________________________________________   LABS (all labs ordered are listed, but only abnormal results are displayed)  Labs Reviewed  URINALYSIS, COMPLETE (UACMP) WITH MICROSCOPIC - Abnormal; Notable for the following:       Result Value   Color, Urine YELLOW (*)    APPearance CLEAR (*)    All other components within normal limits     PROCEDURES  Procedure(s) performed:  None  Procedures  Critical Care performed: No  ____________________________________________   INITIAL IMPRESSION / ASSESSMENT AND PLAN / ED COURSE  Pertinent labs & imaging results that were available during my care of the patient were reviewed by me and considered in my medical decision making (see chart for details).  Urinalysis was negative for hematuria, bacteria or leukocytes. Patient was made aware. Patient is follow-up with a urologist or his PCP in  if any continued problems or continued hematuria. Patient was given a note stating that he was in the emergency room today.      ____________________________________________   FINAL CLINICAL IMPRESSION(S) / ED DIAGNOSES  Final diagnoses:  History of hematuria  Person with feared health complaint in whom no diagnosis is made      NEW MEDICATIONS STARTED DURING THIS VISIT:  There are no discharge medications for this patient.    Note:  This document was prepared using Dragon voice recognition software and may include unintentional dictation errors.    Tommi RumpsRhonda L Stellan Vick, PA-C 03/09/17 1114    Minna AntisKevin Paduchowski, MD 03/09/17 1351

## 2017-03-09 NOTE — Discharge Instructions (Signed)
Follow-up with your primary care doctor in [place] if any continued problems or call the office of Dr. Evelene CroonWolff who is [place]rolog[place] in [place]. Increase fluids. Avoid anti-inflammatories at this time.

## 2017-03-09 NOTE — ED Notes (Signed)
Pt verbalized understanding of discharge instructions. NAD at this time. 

## 2017-03-09 NOTE — ED Triage Notes (Signed)
Pt to ed with c/o right side flank pain last night, reports this am, pain is better but he noticed blood in urine and pressure with voiding.  Pt denies pain at this time.

## 2017-03-12 ENCOUNTER — Emergency Department (HOSPITAL_COMMUNITY)
Admission: EM | Admit: 2017-03-12 | Discharge: 2017-03-12 | Disposition: A | Discharge status: Home / Self Care | Payer: 59 | Attending: Emergency Medicine | Admitting: Emergency Medicine

## 2017-03-12 ENCOUNTER — Encounter (HOSPITAL_COMMUNITY): Payer: Self-pay | Admitting: Emergency Medicine

## 2017-03-12 ENCOUNTER — Emergency Department (HOSPITAL_COMMUNITY): Payer: 59

## 2017-03-12 DIAGNOSIS — F17210 Nicotine dependence, cigarettes, uncomplicated: Secondary | ICD-10-CM | POA: Diagnosis not present

## 2017-03-12 DIAGNOSIS — N3001 Acute cystitis with hematuria: Secondary | ICD-10-CM | POA: Diagnosis not present

## 2017-03-12 DIAGNOSIS — R109 Unspecified abdominal pain: Secondary | ICD-10-CM | POA: Diagnosis present

## 2017-03-12 DIAGNOSIS — R52 Pain, unspecified: Secondary | ICD-10-CM

## 2017-03-12 LAB — COMPREHENSIVE METABOLIC PANEL
ALBUMIN: 4.4 g/dL (ref 3.5–5.0)
ALK PHOS: 49 U/L (ref 38–126)
ALT: 18 U/L (ref 17–63)
ANION GAP: 6 (ref 5–15)
AST: 21 U/L (ref 15–41)
BILIRUBIN TOTAL: 0.7 mg/dL (ref 0.3–1.2)
BUN: 9 mg/dL (ref 6–20)
CALCIUM: 9.4 mg/dL (ref 8.9–10.3)
CO2: 29 mmol/L (ref 22–32)
Chloride: 103 mmol/L (ref 101–111)
Creatinine, Ser: 1.32 mg/dL — ABNORMAL HIGH (ref 0.61–1.24)
GFR calc Af Amer: >60 mL/min (ref >60)
GFR calc non Af Amer: >60 mL/min (ref >60)
GLUCOSE: 87 mg/dL (ref 65–99)
Potassium: 4.3 mmol/L (ref 3.5–5.1)
Sodium: 138 mmol/L (ref 135–145)
TOTAL PROTEIN: 7.4 g/dL (ref 6.5–8.1)

## 2017-03-12 LAB — CBC WITH DIFFERENTIAL/PLATELET
Basophils Absolute: 0 10*3/uL (ref 0.0–0.1)
Basophils Relative: 1 %
Eosinophils Absolute: 0.1 10*3/uL (ref 0.0–0.7)
Eosinophils Relative: 2 %
HEMATOCRIT: 47.4 % (ref 39.0–52.0)
HEMOGLOBIN: 16.5 g/dL (ref 13.0–17.0)
LYMPHS ABS: 2.5 10*3/uL (ref 0.7–4.0)
LYMPHS PCT: 45 %
MCH: 31.4 pg (ref 26.0–34.0)
MCHC: 34.8 g/dL (ref 30.0–36.0)
MCV: 90.3 fL (ref 78.0–100.0)
MONOS PCT: 5 %
Monocytes Absolute: 0.3 10*3/uL (ref 0.1–1.0)
NEUTROS ABS: 2.7 10*3/uL (ref 1.7–7.7)
NEUTROS PCT: 47 %
Platelets: 244 10*3/uL (ref 150–400)
RBC: 5.25 MIL/uL (ref 4.22–5.81)
RDW: 12.5 % (ref 11.5–15.5)
WBC: 5.6 10*3/uL (ref 4.0–10.5)

## 2017-03-12 LAB — URINALYSIS, ROUTINE W REFLEX MICROSCOPIC
Bilirubin Urine: NEGATIVE
Glucose, UA: NEGATIVE mg/dL
KETONES UR: NEGATIVE mg/dL
Leukocytes, UA: NEGATIVE
Nitrite: NEGATIVE
PH: 7 (ref 5.0–8.0)
Protein, ur: NEGATIVE mg/dL
SPECIFIC GRAVITY, URINE: 1.006 (ref 1.005–1.030)

## 2017-03-12 MED ORDER — TRAMADOL HCL 50 MG PO TABS: 50.0000 mg | ORAL_TABLET | Freq: Four times a day (QID) | ORAL | 0 refills | Status: DC | PRN

## 2017-03-12 MED ORDER — CEPHALEXIN 500 MG PO CAPS: 500.0000 mg | ORAL_CAPSULE | Freq: Four times a day (QID) | ORAL | 0 refills | Status: DC

## 2017-03-12 NOTE — ED Triage Notes (Signed)
Patient c/o bilateral flank pain that started on Friday. Per patient pain more predominate on left side. Patient states seen at [facility] On Saturday and had "normal" UA, so he was discharged. Patient states now has hematuria. Denies any nausea, vomiting, fevers, or diarrhea.

## 2017-03-12 NOTE — Discharge Instructions (Signed)
Follow-up with [facility] in 1-2 weeks

## 2017-03-12 NOTE — ED Provider Notes (Signed)
[facility]EMERGENCY DEPT Provider Note   CSN: [id number] Arrival date & time: 03/12/17  1358     History   Chief Complaint Chief Complaint  Patient presents with  . Flank Pain    HPI Tony Pitts is a 21 y.o. male.  Patient complains of right flank pain. No fever no chills   The history is provided by the patient.  Flank Pain  This is a new problem. The current episode started more than 1 week ago. The problem occurs constantly. The problem has not changed since onset.Pertinent negatives include no chest pain, no abdominal pain and no headaches. Nothing aggravates the symptoms. Nothing relieves the symptoms.    Past Medical History:  Diagnosis Date  . Tobacco abuse 12/28/2016    Patient Active Problem List   Diagnosis Date Noted  . Tobacco abuse 12/28/2016    Past Surgical History:  Procedure Laterality Date  . ANTERIOR CRUCIATE LIGAMENT REPAIR         Home Medications    Prior to Admission medications   Medication Sig Start Date End Date Taking? Authorizing Provider  acetaminophen (TYLENOL) 500 MG tablet Take 500 mg by mouth every 6 (six) hours as needed for mild pain or moderate pain.   Yes Historical Provider, MD  cephALEXin (KEFLEX) 500 MG capsule Take 1 capsule (500 mg total) by mouth 4 (four) times daily. 03/12/17   Bethann Berkshire, MD    Family History Family History  Problem Relation Age of Onset  . Cancer Mother     breast  . Asthma Sister     Social History Social History  Substance Use Topics  . Smoking status: Current Every Day Smoker    Packs/day: 0.10    Types: Cigarettes  . Smokeless tobacco: Never Used  . Alcohol use Yes     Comment: socially     Allergies   Patient has no known allergies.   Review of Systems Review of Systems  Constitutional: Negative for appetite change and fatigue.  HENT: Negative for congestion, ear discharge and sinus pressure.   Eyes: Negative for discharge.  Respiratory: Negative for cough.     Cardiovascular: Negative for chest pain.  Gastrointestinal: Negative for abdominal pain and diarrhea.  Genitourinary: Positive for flank pain. Negative for frequency and hematuria.  Musculoskeletal: Negative for back pain.  Skin: Negative for rash.  Neurological: Negative for seizures and headaches.  Psychiatric/Behavioral: Negative for hallucinations.     Physical Exam Updated Vital Signs BP 118/83   Pulse 62   Temp 98.7 F (37.1 C)   Resp 16   Ht 6\' 2"  (1.88 m)   Wt 220 lb (99.8 kg)   SpO2 100%   BMI 28.25 kg/m   Physical Exam  Constitutional: He is oriented to person, place, and time. He appears well-developed.  HENT:  Head: Normocephalic.  Eyes: Conjunctivae and EOM are normal. No scleral icterus.  Neck: Neck supple. No thyromegaly present.  Cardiovascular: Normal rate and regular rhythm.  Exam reveals no gallop and no friction rub.   No murmur heard. Pulmonary/Chest: No stridor. He has no wheezes. He has no rales. He exhibits no tenderness.  Abdominal: He exhibits no distension. There is no tenderness. There is no rebound.  Genitourinary:  Genitourinary Comments: Tender right flank  Musculoskeletal: Normal range of motion. He exhibits no edema.  Lymphadenopathy:    He has no cervical adenopathy.  Neurological: He is oriented to person, place, and time. He exhibits normal muscle tone. Coordination normal.  Skin:  No rash noted. No erythema.  Psychiatric: He has a normal mood and affect. His behavior is normal.  Nursing note and vitals reviewed.    ED Treatments / Results  Labs (all labs ordered are listed, but only abnormal results are displayed) Labs Reviewed  URINALYSIS, ROUTINE W REFLEX MICROSCOPIC - Abnormal; Notable for the following:       Result Value   Hgb urine dipstick LARGE (*)    Bacteria, UA RARE (*)    All other components within normal limits  COMPREHENSIVE METABOLIC PANEL - Abnormal; Notable for the following:    Creatinine, Ser 1.32 (*)     All other components within normal limits  URINE CULTURE  CBC WITH DIFFERENTIAL/PLATELET    EKG  EKG Interpretation None       Radiology Ct Renal Stone Study  Result Date: 03/12/2017 CLINICAL DATA:  Bilateral flank pain for 4 days. Nephrolithiasis. Hematuria. EXAM: CT ABDOMEN AND PELVIS WITHOUT CONTRAST TECHNIQUE: Multidetector CT imaging of the abdomen and pelvis was performed following the standard protocol without IV contrast. COMPARISON:  06/26/2014 FINDINGS: Lower chest: No acute findings. Hepatobiliary: No masses visualized on this unenhanced exam. Gallbladder is unremarkable. Pancreas: No mass or inflammatory process visualized on this unenhanced exam. Spleen:  Within normal limits in size. Adrenals/Urinary tract: No evidence of urolithiasis or hydronephrosis. Diffuse bladder wall thickening noted for degree of bladder distention. This is stable since previous study and suspicious for chronic cystitis. Stomach/Bowel: No evidence of obstruction, inflammatory process, or abnormal fluid collections. Vascular/Lymphatic: No pathologically enlarged lymph nodes identified. No evidence of abdominal aortic aneurysm. Reproductive:  Normal size prostate and seminal vesicles. Other:  None. Musculoskeletal:  No suspicious bone lesions identified. IMPRESSION: No evidence of urolithiasis, hydronephrosis, or other acute findings. Stable diffuse bladder wall thickening, suspicious for chronic cystitis. Consider cystoscopy for further evaluation. Electronically Signed   By: Myles RosenthalJohn  Stahl M.D.   On: 03/12/2017 17:11    Procedures Procedures (including critical care time)  Medications Ordered in ED Medications - No data to display   Initial Impression / Assessment and Plan / ED Course  I have reviewed the triage vital signs and the nursing notes.  Pertinent labs & imaging results that were available during my care of the patient were reviewed by me and considered in my medical decision making (see chart  for details).     Patient with hematuria. CT scan negative. Patient will have urine cultured   patient will be started on Keflex. And follow-up with urology  Final Clinical Impressions(s) / ED Diagnoses   Final diagnoses:  Pain  Acute cystitis with hematuria    New Prescriptions New Prescriptions   CEPHALEXIN (KEFLEX) 500 MG CAPSULE    Take 1 capsule (500 mg total) by mouth 4 (four) times daily.     Bethann BerkshireJoseph Tommie Bohlken, MD 03/12/17 1806

## 2017-03-12 NOTE — ED Notes (Signed)
Pt made aware to return if symptoms worsen or if any life threatening symptoms occur.   

## 2017-03-13 ENCOUNTER — Ambulatory Visit: Payer: Self-pay | Admitting: Family Medicine

## 2017-03-14 LAB — URINE CULTURE: Culture: NO GROWTH

## 2018-03-10 ENCOUNTER — Encounter: Payer: Self-pay | Admitting: Family Medicine

## 2018-03-31 ENCOUNTER — Emergency Department (HOSPITAL_COMMUNITY)
Admission: EM | Admit: 2018-03-31 | Discharge: 2018-04-01 | Disposition: A | Discharge status: Home / Self Care | Payer: 59 | Attending: Emergency Medicine | Admitting: Emergency Medicine

## 2018-03-31 ENCOUNTER — Emergency Department (HOSPITAL_COMMUNITY): Payer: 59

## 2018-03-31 ENCOUNTER — Encounter (HOSPITAL_COMMUNITY): Payer: Self-pay | Admitting: Emergency Medicine

## 2018-03-31 DIAGNOSIS — Y990 Civilian activity done for income or pay: Secondary | ICD-10-CM | POA: Diagnosis not present

## 2018-03-31 DIAGNOSIS — Y9259 Other trade areas as the place of occurrence of the external cause: Secondary | ICD-10-CM | POA: Insufficient documentation

## 2018-03-31 DIAGNOSIS — Z79899 Other long term (current) drug therapy: Secondary | ICD-10-CM | POA: Insufficient documentation

## 2018-03-31 DIAGNOSIS — F17210 Nicotine dependence, cigarettes, uncomplicated: Secondary | ICD-10-CM | POA: Diagnosis not present

## 2018-03-31 DIAGNOSIS — W228XXA Striking against or struck by other objects, initial encounter: Secondary | ICD-10-CM | POA: Insufficient documentation

## 2018-03-31 DIAGNOSIS — Y9389 Activity, other specified: Secondary | ICD-10-CM | POA: Diagnosis not present

## 2018-03-31 DIAGNOSIS — S3992XA Unspecified injury of lower back, initial encounter: Secondary | ICD-10-CM | POA: Diagnosis not present

## 2018-03-31 NOTE — ED Notes (Signed)
Patient transported to X-ray 

## 2018-03-31 NOTE — ED Triage Notes (Signed)
Pt states he was at work when a forklift was carrying pallets and it ran into his back. Reports L lower back no, no deformity noted, no bruising noted.

## 2018-04-01 ENCOUNTER — Encounter (HOSPITAL_COMMUNITY): Payer: Self-pay | Admitting: Student

## 2018-04-01 MED ORDER — NAPROXEN 500 MG PO TABS: 500.0000 mg | ORAL_TABLET | Freq: Two times a day (BID) | ORAL | 0 refills | Status: DC

## 2018-04-01 MED ORDER — NAPROXEN 250 MG PO TABS
500.0000 mg | ORAL_TABLET | Freq: Once | ORAL | Status: AC
  Administered 2018-04-01: 500 mg via ORAL
  Filled 2018-04-01: qty 2

## 2018-04-01 NOTE — ED Provider Notes (Signed)
[facility] EMERGENCY DEPARTMENT Provider Note   CSN: [id number] Arrival date & time: 03/31/18  2223     History   Chief Complaint Chief Complaint  Patient presents with  . Back Pain    HPI Tony Pitts is a 22 y.o. male with a history of tobacco abuse who presents to the emergency department status post injury at work complaining of left lower back pain.  Patient states that he was at work when there was a forklift carrying wooden pallets and the pallets slipped forward and hit him in the back.  Patient states that he did not fall or have any head injury or loss of consciousness related to this injury.  States he has had constant discomfort to the left lower back that is a 6 out of 10 in severity, worse with movement, alleviated by nothing.  He has not had medications prior to arrival.  No other areas of discomfort.  Denies numbness, weakness, tingling, incontinence, hematuria, or blood in stool.  HPI  Past Medical History:  Diagnosis Date  . Tobacco abuse 12/28/2016    Patient Active Problem List   Diagnosis Date Noted  . Tobacco abuse 12/28/2016    Past Surgical History:  Procedure Laterality Date  . ANTERIOR CRUCIATE LIGAMENT REPAIR          Home Medications    Prior to Admission medications   Medication Sig Start Date End Date Taking? Authorizing Provider  acetaminophen (TYLENOL) 500 MG tablet Take 500 mg by mouth every 6 (six) hours as needed for mild pain or moderate pain.    [provider]  cephALEXin (KEFLEX) 500 MG capsule Take 1 capsule (500 mg total) by mouth 4 (four) times daily. 03/12/17   Bethann Berkshire, MD  traMADol (ULTRAM) 50 MG tablet Take 1 tablet (50 mg total) by mouth every 6 (six) hours as needed. 03/12/17   Bethann Berkshire, MD    Family History Family History  Problem Relation Age of Onset  . Cancer Mother        breast  . Asthma Sister     Social History Social History   Tobacco Use  . Smoking status:  Current Every Day Smoker    Packs/day: 0.25    Types: Cigarettes  . Smokeless tobacco: Never Used  Substance Use Topics  . Alcohol use: Yes    Comment: socially  . Drug use: Not Currently    Types: Marijuana     Allergies   Patient has no known allergies.   Review of Systems Review of Systems  Gastrointestinal: Negative for blood in stool.  Genitourinary: Negative for hematuria.  Musculoskeletal: Positive for back pain. Negative for neck pain.  Skin: Negative for color change.  Neurological: Negative for weakness and numbness.       Negative for paresthesias.  Negative for incontinence.     Physical Exam Updated Vital Signs BP (!) 142/91 (BP Location: Right Arm)   Pulse 69   Temp 98.9 F (37.2 C) (Oral)   Resp 16   Ht 6\' 2"  (1.88 m)   Wt 95.3 kg (210 lb)   SpO2 100%   BMI 26.96 kg/m   Physical Exam  Constitutional: He appears well-developed and well-nourished. No distress.  HENT:  Head: Normocephalic and atraumatic.  Eyes: Conjunctivae are normal. Right eye exhibits no discharge. Left eye exhibits no discharge.  Cardiovascular: Normal rate and regular rhythm.  Pulmonary/Chest: Effort normal and breath sounds normal.  Abdominal: Soft. Bowel sounds are normal.  He exhibits no distension. There is no tenderness.  Musculoskeletal:  Back: No appreciable swelling, erythema, or ecchymosis.  Patient has no midline tenderness to palpation.  There is left lumbar paraspinal muscle tenderness to palpation.  No point/focal bony tenderness. Extremities: No focal tenderness.  Neurological: He is alert.  Clear speech.  Patient sensation grossly intact bilateral lower extremities.  Patient has 5 out of 5 strength with plantar and dorsiflexion bilaterally.  Patellar DTRs are 2+ and symmetric.  Gait is intact.  Psychiatric: He has a normal mood and affect. His behavior is normal. Thought content normal.  Nursing note and vitals reviewed.   ED Treatments / Results  Labs (all  labs ordered are listed, but only abnormal results are displayed) Labs Reviewed - No data to display  EKG None  Radiology Dg Lumbar Spine 2-3 Views  Result Date: 03/31/2018 CLINICAL DATA:  Work injury from [employer]. EXAM: LUMBAR SPINE - 2-3 VIEW COMPARISON:  CT abdomen and pelvis March 12, 2017 FINDINGS: There is no evidence of lumbar spine fracture. Transitional anatomy, sacralized L5 vertebral body. Alignment is normal. Intervertebral disc spaces are maintained (congenitally small L5-S1 disc). IMPRESSION: Negative. Electronically Signed   By: Awilda Metroourtnay  Bloomer M.D.   On: 03/31/2018 23:29    Procedures Procedures (including critical care time)  Medications Ordered in ED Medications  naproxen (NAPROSYN) tablet 500 mg (has no administration in time range)     Initial Impression / Assessment and Plan / ED Course  I have reviewed the triage vital signs and the nursing notes.  Pertinent labs & imaging results that were available during my care of the patient were reviewed by me and considered in my medical decision making (see chart for details).   Patient presents status post back injury at work.  Patient is nontoxic-appearing, in no apparent distress.  Exam with left lumbar paraspinal muscle tenderness to palpation.  No overlying ecchymosis or deformities.  No focal bony or midline tenderness.  Patient is neurovascularly intact distally.  X-ray ordered per triage the lumbar spine is negative. Will treat with naproxen. I discussed results, treatment plan, need for PCP follow-up, and return precautions with the patient. Provided opportunity for questions, patient confirmed understanding and is in agreement with plan.   Vitals:   03/31/18 2247 04/01/18 0028  BP: (!) 142/91 134/70  Pulse: 69 67  Resp: 16 18  Temp: 98.9 F (37.2 C)   SpO2: 100% 98%    Final Clinical Impressions(s) / ED Diagnoses   Final diagnoses:  Injury of back, initial encounter    ED Discharge Orders         Ordered    naproxen (NAPROSYN) 500 MG tablet  2 times daily     04/01/18 0011       Sadrac Zeoli, [place]antha R, PA-C 04/01/18 0038    Dione BoozeGlick, David, MD 04/01/18 [phone]

## 2018-04-01 NOTE — Discharge Instructions (Signed)
You were seen in the emergency department following an injury to your back at work.  The x-ray of your lower back did not show any fractures or dislocations.  We are treating you with a medication called naproxen. Naproxen is a nonsteroidal anti-inflammatory medication that will help with pain and swelling. Be sure to take this medication as prescribed with food, 1 pill every 12 hours,  It should be taken with food, as it can cause stomach upset, and more seriously, stomach bleeding. Do not take other nonsteroidal anti-inflammatory medications with this such as Advil, Motrin, or Aleve.  You may supplement with over-the-counter dosing of Tylenol.  We have prescribed you new medication(s) today. Discuss the medications prescribed today with your pharmacist as they can have adverse effects and interactions with your other medicines including over the counter and prescribed medications. Seek medical evaluation if you start to experience new or abnormal symptoms after taking one of these medicines, seek care immediately if you start to experience difficulty breathing, feeling of your throat closing, facial swelling, or rash as these could be indications of a more serious allergic reaction.   Follow-up with your primary care provider in 1 week for reevaluation.  Return to the emergency department for any new or worsening symptoms including but not limited to numbness, weakness, loss of control of your bladder function, blood in your stool, blood in your urine, or any other concerns that you may have.

## 2018-10-12 ENCOUNTER — Other Ambulatory Visit: Payer: Self-pay

## 2018-10-12 ENCOUNTER — Encounter (HOSPITAL_COMMUNITY): Payer: Self-pay | Admitting: Emergency Medicine

## 2018-10-12 ENCOUNTER — Emergency Department (HOSPITAL_COMMUNITY)
Admission: EM | Admit: 2018-10-12 | Discharge: 2018-10-12 | Disposition: A | Discharge status: Home / Self Care | Payer: 59 | Attending: Emergency Medicine | Admitting: Emergency Medicine

## 2018-10-12 DIAGNOSIS — F17210 Nicotine dependence, cigarettes, uncomplicated: Secondary | ICD-10-CM | POA: Insufficient documentation

## 2018-10-12 DIAGNOSIS — F10129 Alcohol abuse with intoxication, unspecified: Secondary | ICD-10-CM | POA: Diagnosis present

## 2018-10-12 DIAGNOSIS — K2900 Acute gastritis without bleeding: Secondary | ICD-10-CM | POA: Diagnosis not present

## 2018-10-12 LAB — CBC WITH DIFFERENTIAL/PLATELET
Abs Immature Granulocytes: 0.01 10*3/uL (ref 0.00–0.07)
BASOS ABS: 0 10*3/uL (ref 0.0–0.1)
BASOS PCT: 0 %
EOS ABS: 0 10*3/uL (ref 0.0–0.5)
EOS PCT: 0 %
HCT: 46.7 % (ref 39.0–52.0)
Hemoglobin: 15 g/dL (ref 13.0–17.0)
Immature Granulocytes: 0 %
LYMPHS PCT: 20 %
Lymphs Abs: 1.1 10*3/uL (ref 0.7–4.0)
MCH: 30.1 pg (ref 26.0–34.0)
MCHC: 32.1 g/dL (ref 30.0–36.0)
MCV: 93.8 fL (ref 80.0–100.0)
Monocytes Absolute: 0.3 10*3/uL (ref 0.1–1.0)
Monocytes Relative: 6 %
NRBC: 0 % (ref 0.0–0.2)
Neutro Abs: 4 10*3/uL (ref 1.7–7.7)
Neutrophils Relative %: 74 %
Platelets: 226 10*3/uL (ref 150–400)
RBC: 4.98 MIL/uL (ref 4.22–5.81)
RDW: 13 % (ref 11.5–15.5)
WBC: 5.5 10*3/uL (ref 4.0–10.5)

## 2018-10-12 LAB — COMPREHENSIVE METABOLIC PANEL
ALT: 19 U/L (ref 0–44)
ANION GAP: 8 (ref 5–15)
AST: 23 U/L (ref 15–41)
Albumin: 4.3 g/dL (ref 3.5–5.0)
Alkaline Phosphatase: 48 U/L (ref 38–126)
BUN: 12 mg/dL (ref 6–20)
CO2: 25 mmol/L (ref 22–32)
Calcium: 9 mg/dL (ref 8.9–10.3)
Chloride: 109 mmol/L (ref 98–111)
Creatinine, Ser: 1.2 mg/dL (ref 0.61–1.24)
GFR calc non Af Amer: >60 mL/min (ref >60)
Glucose, Bld: 92 mg/dL (ref 70–99)
POTASSIUM: 4.3 mmol/L (ref 3.5–5.1)
SODIUM: 142 mmol/L (ref 135–145)
TOTAL PROTEIN: 7.4 g/dL (ref 6.5–8.1)
Total Bilirubin: 0.7 mg/dL (ref 0.3–1.2)

## 2018-10-12 LAB — LIPASE, BLOOD: LIPASE: 29 U/L (ref 11–51)

## 2018-10-12 LAB — ETHANOL: Alcohol, Ethyl (B): 27 mg/dL — ABNORMAL HIGH (ref <10)

## 2018-10-12 MED ORDER — ONDANSETRON HCL 4 MG PO TABS: 4.0000 mg | ORAL_TABLET | Freq: Four times a day (QID) | ORAL | 0 refills | Status: DC

## 2018-10-12 MED ORDER — ONDANSETRON HCL 4 MG/2ML IJ SOLN
4.0000 mg | Freq: Once | INTRAMUSCULAR | Status: AC
  Administered 2018-10-12: 4 mg via INTRAVENOUS
  Filled 2018-10-12: qty 2

## 2018-10-12 MED ORDER — FAMOTIDINE IN NACL 20-0.9 MG/50ML-% IV SOLN
20.0000 mg | Freq: Once | INTRAVENOUS | Status: AC
  Administered 2018-10-12: 20 mg via INTRAVENOUS
  Filled 2018-10-12: qty 50

## 2018-10-12 MED ORDER — SODIUM CHLORIDE 0.9 % IV BOLUS
1000.0000 mL | Freq: Once | INTRAVENOUS | Status: AC
  Administered 2018-10-12 (×2): 1000 mL via INTRAVENOUS

## 2018-10-12 MED ORDER — SODIUM CHLORIDE 0.9 % IV BOLUS
1000.0000 mL | Freq: Once | INTRAVENOUS | Status: AC
Start: 2018-10-12 — End: 2018-10-12
  Administered 2018-10-12: 1000 mL via INTRAVENOUS

## 2018-10-12 NOTE — Discharge Instructions (Addendum)
Your blood work was good.  Clear liquids today.  Prescription for nausea medicine.

## 2018-10-12 NOTE — ED Triage Notes (Signed)
Pt states he feels like he has alcohol poisoning . Pt reports he drank 3 5th's of liquor last night. Pt reports shaking and nauseated.

## 2018-10-12 NOTE — ED Provider Notes (Signed)
[facility] EMERGENCY DEPARTMENT Provider Note   CSN: [id number] Arrival date & time: 10/12/18  0744     History   Chief Complaint Chief Complaint  Patient presents with  . Alcohol Intoxication    HPI Tony Pitts is a 22 y.o. male.  Patient drank an excessive amount of alcohol last [birth year] his birthday.  He started vomiting approximately 10:30 PM last night.  He feels dehydrated and unable to keep fluids down.  Severity of symptoms is moderate.  Nothing makes symptoms better or worse.  He is normally healthy.     Past Medical History:  Diagnosis Date  . Tobacco abuse 12/28/2016    Patient Active Problem List   Diagnosis Date Noted  . Tobacco abuse 12/28/2016    Past Surgical History:  Procedure Laterality Date  . ANTERIOR CRUCIATE LIGAMENT REPAIR          Home Medications    Prior to Admission medications   Medication Sig Start Date End Date Taking? Authorizing Provider  albuterol (PROVENTIL HFA;VENTOLIN HFA) 108 (90 Base) MCG/ACT inhaler Inhale 1-2 puffs into the lungs every 6 (six) hours as needed for wheezing or shortness of breath.   Yes [provider]  acetaminophen (TYLENOL) 500 MG tablet Take 500 mg by mouth every 6 (six) hours as needed for mild pain or moderate pain.    [provider]  ondansetron (ZOFRAN) 4 MG tablet Take 1 tablet (4 mg total) by mouth every 6 (six) hours. 10/12/18   Donnetta Hutching, MD    Family History Family History  Problem Relation Age of Onset  . Cancer Mother        breast  . Asthma Sister     Social History Social History   Tobacco Use  . Smoking status: Current Every Day Smoker    Packs/day: 0.25    Types: Cigarettes  . Smokeless tobacco: Never Used  Substance Use Topics  . Alcohol use: Yes    Comment: socially  . Drug use: Not Currently    Types: Marijuana     Allergies   Patient has no known allergies.   Review of Systems Review of Systems  All other systems reviewed and are  negative.    Physical Exam Updated Vital Signs BP 118/70   Pulse 60   Temp 98.2 F (36.8 C) (Oral)   Resp 16   Ht 6\' 2"  (1.88 m)   Wt 97.5 kg   SpO2 99%   BMI 27.60 kg/m   Physical Exam  Constitutional: He is oriented to person, place, and time. He appears well-developed.  Dry mucous membranes.  HENT:  Head: Normocephalic and atraumatic.  Eyes: Conjunctivae are normal.  Neck: Neck supple.  Cardiovascular: Normal rate and regular rhythm.  Pulmonary/Chest: Effort normal and breath sounds normal.  Abdominal: Soft. Bowel sounds are normal.  Musculoskeletal: Normal range of motion.  Neurological: He is alert and oriented to person, place, and time.  Skin: Skin is warm and dry.  Psychiatric: He has a normal mood and affect. His behavior is normal.  Nursing note and vitals reviewed.    ED Treatments / Results  Labs (all labs ordered are listed, but only abnormal results are displayed) Labs Reviewed  ETHANOL - Abnormal; Notable for the following components:      Result Value   Alcohol, Ethyl (B) 27 (*)    All other components within normal limits  CBC WITH DIFFERENTIAL/PLATELET  COMPREHENSIVE METABOLIC PANEL  LIPASE, BLOOD    EKG  None  Radiology No results found.  Procedures Procedures (including critical care time)  Medications Ordered in ED Medications  sodium chloride 0.9 % bolus 1,000 mL (0 mLs Intravenous Stopped 10/12/18 1022)  ondansetron (ZOFRAN) injection 4 mg (4 mg Intravenous Given 10/12/18 0913)  famotidine (PEPCID) IVPB 20 mg premix (0 mg Intravenous Stopped 10/12/18 1101)  sodium chloride 0.9 % bolus 1,000 mL (0 mLs Intravenous Stopped 10/12/18 1023)     Initial Impression / Assessment and Plan / ED Course  I have reviewed the triage vital signs and the nursing notes.  Pertinent labs & imaging results that were available during my care of the patient were reviewed by me and considered in my medical decision making (see chart for details).      History and physical most consistent with gastritis secondary to alcohol consumption.  Liver functions were normal.  He feels much better after 2 L of IV fluids, IV Zofran, IV Pepcid.  Discharge medications Zofran 4 mg  Final Clinical Impressions(s) / ED Diagnoses   Final diagnoses:  Acute gastritis without hemorrhage, unspecified gastritis type    ED Discharge Orders         Ordered    ondansetron (ZOFRAN) 4 MG tablet  Every 6 hours     10/12/18 1157           Donnetta Hutching, MD 10/12/18 1245

## 2018-10-12 NOTE — ED Notes (Signed)
Pt states his nausea is better and is lying in bed with girlfriend.

## 2018-10-12 NOTE — ED Notes (Signed)
Pt states he is feeling better.  Given po gingerale and instructed to sip.  Asking how much longer he is going to be here.

## 2018-10-19 ENCOUNTER — Encounter (HOSPITAL_COMMUNITY): Payer: Self-pay | Admitting: Emergency Medicine

## 2018-10-19 ENCOUNTER — Emergency Department (HOSPITAL_COMMUNITY): Payer: 59

## 2018-10-19 ENCOUNTER — Other Ambulatory Visit: Payer: Self-pay

## 2018-10-19 ENCOUNTER — Emergency Department (HOSPITAL_COMMUNITY)
Admission: EM | Admit: 2018-10-19 | Discharge: 2018-10-19 | Disposition: A | Discharge status: Home / Self Care | Payer: 59 | Attending: Emergency Medicine | Admitting: Emergency Medicine

## 2018-10-19 DIAGNOSIS — F17210 Nicotine dependence, cigarettes, uncomplicated: Secondary | ICD-10-CM | POA: Diagnosis not present

## 2018-10-19 DIAGNOSIS — R1030 Lower abdominal pain, unspecified: Secondary | ICD-10-CM | POA: Insufficient documentation

## 2018-10-19 DIAGNOSIS — Z79899 Other long term (current) drug therapy: Secondary | ICD-10-CM | POA: Diagnosis not present

## 2018-10-19 DIAGNOSIS — R109 Unspecified abdominal pain: Secondary | ICD-10-CM

## 2018-10-19 LAB — CBC
HCT: 46 % (ref 39.0–52.0)
Hemoglobin: 15 g/dL (ref 13.0–17.0)
MCH: 30.4 pg (ref 26.0–34.0)
MCHC: 32.6 g/dL (ref 30.0–36.0)
MCV: 93.1 fL (ref 80.0–100.0)
PLATELETS: 217 10*3/uL (ref 150–400)
RBC: 4.94 MIL/uL (ref 4.22–5.81)
RDW: 12.5 % (ref 11.5–15.5)
WBC: 4.5 10*3/uL (ref 4.0–10.5)
nRBC: 0 % (ref 0.0–0.2)

## 2018-10-19 LAB — COMPREHENSIVE METABOLIC PANEL
ALBUMIN: 4.3 g/dL (ref 3.5–5.0)
ALT: 18 U/L (ref 0–44)
AST: 21 U/L (ref 15–41)
Alkaline Phosphatase: 49 U/L (ref 38–126)
Anion gap: 4 — ABNORMAL LOW (ref 5–15)
BUN: 12 mg/dL (ref 6–20)
CALCIUM: 9.5 mg/dL (ref 8.9–10.3)
CO2: 28 mmol/L (ref 22–32)
Chloride: 106 mmol/L (ref 98–111)
Creatinine, Ser: 1.31 mg/dL — ABNORMAL HIGH (ref 0.61–1.24)
GFR calc Af Amer: >60 mL/min (ref >60)
GFR calc non Af Amer: >60 mL/min (ref >60)
Glucose, Bld: 94 mg/dL (ref 70–99)
POTASSIUM: 4.5 mmol/L (ref 3.5–5.1)
SODIUM: 138 mmol/L (ref 135–145)
TOTAL PROTEIN: 7.4 g/dL (ref 6.5–8.1)
Total Bilirubin: 0.8 mg/dL (ref 0.3–1.2)

## 2018-10-19 LAB — URINALYSIS, ROUTINE W REFLEX MICROSCOPIC
BILIRUBIN URINE: NEGATIVE
Bacteria, UA: NONE SEEN
GLUCOSE, UA: NEGATIVE mg/dL
HGB URINE DIPSTICK: NEGATIVE
Ketones, ur: NEGATIVE mg/dL
Nitrite: NEGATIVE
Protein, ur: NEGATIVE mg/dL
SPECIFIC GRAVITY, URINE: 1.03 (ref 1.005–1.030)
pH: 6 (ref 5.0–8.0)

## 2018-10-19 LAB — LIPASE, BLOOD: LIPASE: 24 U/L (ref 11–51)

## 2018-10-19 MED ORDER — IOPAMIDOL (ISOVUE-300) INJECTION 61%
100.0000 mL | Freq: Once | INTRAVENOUS | Status: AC | PRN
  Administered 2018-10-19: 100 mL via INTRAVENOUS

## 2018-10-19 MED ORDER — FAMOTIDINE IN NACL 20-0.9 MG/50ML-% IV SOLN
20.0000 mg | Freq: Once | INTRAVENOUS | Status: AC
  Administered 2018-10-19: 20 mg via INTRAVENOUS
  Filled 2018-10-19: qty 50

## 2018-10-19 MED ORDER — DICYCLOMINE HCL 20 MG PO TABS: 20.0000 mg | ORAL_TABLET | Freq: Four times a day (QID) | ORAL | 0 refills | Status: DC | PRN

## 2018-10-19 MED ORDER — SODIUM CHLORIDE 0.9 % IV BOLUS
1000.0000 mL | Freq: Once | INTRAVENOUS | Status: AC
  Administered 2018-10-19: 1000 mL via INTRAVENOUS

## 2018-10-19 MED ORDER — DICYCLOMINE HCL 10 MG/ML IM SOLN
20.0000 mg | Freq: Once | INTRAMUSCULAR | Status: AC
  Administered 2018-10-19: 20 mg via INTRAMUSCULAR
  Filled 2018-10-19: qty 2

## 2018-10-19 NOTE — ED Triage Notes (Signed)
Patient complains of lower abdominal pain that started last week. Patient was seen last week for same due to alcohol. Pt denies alcohol since last week. States nausea. Denies vomiting or diarrhea.

## 2018-10-19 NOTE — ED Notes (Signed)
Pt transported to CT ?

## 2018-10-19 NOTE — ED Provider Notes (Signed)
[facility] EMERGENCY DEPARTMENT Provider Note   CSN: [id number] Arrival date & time: 10/19/18  1039     History   Chief Complaint Chief Complaint  Patient presents with  . Abdominal Pain    HPI ZVI DUPLANTIS is a 22 y.o. male.  HPI  Pt was seen at 1200.  Per pt, c/o gradual onset and persistence of constant generalized abd "pain" for the past 1 week. Pt states the pain started generalized abd, now located lower abd. Has been associated with no other symptoms.  Describes the abd pain as "dull" with intermittent "cramping."  Denies N/V, no diarrhea, no fevers, no back pain, no rash, no CP/SOB, no black or blood in stools, no dysuria/hematuria, no testicular pain/swelling.      Past Medical History:  Diagnosis Date  . Tobacco abuse 12/28/2016    Patient Active Problem List   Diagnosis Date Noted  . Tobacco abuse 12/28/2016    Past Surgical History:  Procedure Laterality Date  . ANTERIOR CRUCIATE LIGAMENT REPAIR          Home Medications    Prior to Admission medications   Medication Sig Start Date End Date Taking? Authorizing Provider  acetaminophen (TYLENOL) 500 MG tablet Take 500 mg by mouth every 6 (six) hours as needed for mild pain or moderate pain.    [provider]  albuterol (PROVENTIL HFA;VENTOLIN HFA) 108 (90 Base) MCG/ACT inhaler Inhale 1-2 puffs into the lungs every 6 (six) hours as needed for wheezing or shortness of breath.    [provider]  ondansetron (ZOFRAN) 4 MG tablet Take 1 tablet (4 mg total) by mouth every 6 (six) hours. 10/12/18   Donnetta Hutching, MD    Family History Family History  Problem Relation Age of Onset  . Cancer Mother        breast  . Asthma Sister     Social History Social History   Tobacco Use  . Smoking status: Current Every Day Smoker    Packs/day: 0.25    Types: Cigarettes  . Smokeless tobacco: Never Used  Substance Use Topics  . Alcohol use: Yes    Comment: socially  . Drug use: Not  Currently    Types: Marijuana     Allergies   Patient has no known allergies.   Review of Systems Review of Systems ROS: Statement: All systems negative except as marked or noted in the HPI; Constitutional: Negative for fever and chills. ; ; Eyes: Negative for eye pain, redness and discharge. ; ; ENMT: Negative for ear pain, hoarseness, nasal congestion, sinus pressure and sore throat. ; ; Cardiovascular: Negative for chest pain, palpitations, diaphoresis, dyspnea and peripheral edema. ; ; Respiratory: Negative for cough, wheezing and stridor. ; ; Gastrointestinal: +abd pain. Negative for nausea, vomiting, diarrhea, blood in stool, hematemesis, jaundice and rectal bleeding. . ; ; Genitourinary: Negative for dysuria, flank pain and hematuria. ; ; Genital:  No penile drainage or rash, no testicular pain or swelling, no scrotal rash or swelling. ;; Musculoskeletal: Negative for back pain and neck pain. Negative for swelling and trauma.; ; Skin: Negative for pruritus, rash, abrasions, blisters, bruising and skin lesion.; ; Neuro: Negative for headache, lightheadedness and neck stiffness. Negative for weakness, altered level of consciousness, altered mental status, extremity weakness, paresthesias, involuntary movement, seizure and syncope.       Physical Exam Updated Vital Signs BP 114/64 (BP Location: Left Arm)   Pulse (!) 52   Temp 98.2 F (36.8 C) (Oral)  Resp 16   Ht 6\' 2"  (1.88 m)   Wt 97.5 kg   SpO2 99%   BMI 27.60 kg/m   Physical Exam 1205: Physical examination:  Nursing notes reviewed; Vital signs and O2 SAT reviewed;  Constitutional: Well developed, Well nourished, Well hydrated, In no acute distress; Head:  Normocephalic, atraumatic; Eyes: EOMI, PERRL, No scleral icterus; ENMT: Mouth and pharynx normal, Mucous membranes moist; Neck: Supple, Full range of motion, No lymphadenopathy; Cardiovascular: Regular rate and rhythm, No gallop; Respiratory: Breath sounds clear & equal  bilaterally, No wheezes.  Speaking full sentences with ease, Normal respiratory effort/excursion; Chest: Nontender, Movement normal; Abdomen: Soft, +mild diffuse tenderness to palp. No rebound or guarding. Nondistended, Normal bowel sounds; Genitourinary: No CVA tenderness; Extremities: Peripheral pulses normal, No tenderness, No edema, No calf edema or asymmetry.; Neuro: AA&Ox3, Major CN grossly intact.  Speech clear. No gross focal motor or sensory deficits in extremities. Climbs on and off stretcher easily by himself. Gait steady..; Skin: Color normal, Warm, Dry.   ED Treatments / Results  Labs (all labs ordered are listed, but only abnormal results are displayed)   EKG None  Radiology   Procedures Procedures (including critical care time)  Medications Ordered in ED Medications  sodium chloride 0.9 % bolus 1,000 mL (1,000 mLs Intravenous New Bag/Given 10/19/18 1219)  famotidine (PEPCID) IVPB 20 mg premix (20 mg Intravenous New Bag/Given 10/19/18 1220)  dicyclomine (BENTYL) injection 20 mg (20 mg Intramuscular Given 10/19/18 1221)  iopamidol (ISOVUE-300) 61 % injection 100 mL (100 mLs Intravenous Contrast Given 10/19/18 1234)     Initial Impression / Assessment and Plan / ED Course  I have reviewed the triage vital signs and the nursing notes.  Pertinent labs & imaging results that were available during my care of the patient were reviewed by me and considered in my medical decision making (see chart for details).  MDM Reviewed: previous chart, nursing note and vitals Reviewed previous: labs Interpretation: labs and CT scan   Results for orders placed or performed during the hospital encounter of 10/19/18  Lipase, blood  Result Value Ref Range   Lipase 24 11 - 51 U/L  Comprehensive metabolic panel  Result Value Ref Range   Sodium 138 135 - 145 mmol/L   Potassium 4.5 3.5 - 5.1 mmol/L   Chloride 106 98 - 111 mmol/L   CO2 28 22 - 32 mmol/L   Glucose, Bld 94 70 - 99 mg/dL    BUN 12 6 - 20 mg/dL   Creatinine, Ser 1.61 (H) 0.61 - 1.24 mg/dL   Calcium 9.5 8.9 - 09.6 mg/dL   Total Protein 7.4 6.5 - 8.1 g/dL   Albumin 4.3 3.5 - 5.0 g/dL   AST 21 15 - 41 U/L   ALT 18 0 - 44 U/L   Alkaline Phosphatase 49 38 - 126 U/L   Total Bilirubin 0.8 0.3 - 1.2 mg/dL   GFR calc non Af Amer >60 >60 mL/min   GFR calc Af Amer >60 >60 mL/min   Anion gap 4 (L) 5 - 15  CBC  Result Value Ref Range   WBC 4.5 4.0 - 10.5 K/uL   RBC 4.94 4.22 - 5.81 MIL/uL   Hemoglobin 15.0 13.0 - 17.0 g/dL   HCT 04.5 40.9 - 81.1 %   MCV 93.1 80.0 - 100.0 fL   MCH 30.4 26.0 - 34.0 pg   MCHC 32.6 30.0 - 36.0 g/dL   RDW 91.4 78.2 - 95.6 %   Platelets  217 150 - 400 K/uL   nRBC 0.0 0.0 - 0.2 %  Urinalysis, Routine w reflex microscopic  Result Value Ref Range   Color, Urine AMBER (A) YELLOW   APPearance HAZY (A) CLEAR   Specific Gravity, Urine 1.030 1.005 - 1.030   pH 6.0 5.0 - 8.0   Glucose, UA NEGATIVE NEGATIVE mg/dL   Hgb urine dipstick NEGATIVE NEGATIVE   Bilirubin Urine NEGATIVE NEGATIVE   Ketones, ur NEGATIVE NEGATIVE mg/dL   Protein, ur NEGATIVE NEGATIVE mg/dL   Nitrite NEGATIVE NEGATIVE   Leukocytes, UA TRACE (A) NEGATIVE   RBC / HPF 0-5 0 - 5 RBC/hpf   WBC, UA 6-10 0 - 5 WBC/hpf   Bacteria, UA NONE SEEN NONE SEEN   Squamous Epithelial / LPF 0-5 0 - 5   Mucus PRESENT    Ct Abdomen Pelvis W Contrast Result Date: 10/19/2018 CLINICAL DATA:  Lower abdominal pain beginning this week with nausea. EXAM: CT ABDOMEN AND PELVIS WITH CONTRAST TECHNIQUE: Multidetector CT imaging of the abdomen and pelvis was performed using the standard protocol following bolus administration of intravenous contrast. CONTRAST:  ISOVUE-300 IOPAMIDOL (ISOVUE-300) INJECTION 61% COMPARISON:  03/12/2017 FINDINGS: Lower chest: Normal. Hepatobiliary: Gallbladder and biliary tree are normal. There is a 1.3 cm rounded hypervascular mass over the posterior segment right lobe of the liver. Pancreas: Normal. Spleen:  Normal. Adrenals/Urinary Tract: Adrenal glands are normal. Kidneys are normal in size without hydronephrosis or nephrolithiasis. Ureters and bladder are normal. Stomach/Bowel: Stomach and small bowel are within normal. Appendix is normal. Colon is normal. Vascular/Lymphatic: Normal. Reproductive: Normal. Other: No free fluid or focal inflammatory change. Musculoskeletal: Unremarkable. IMPRESSION: No acute findings in the abdomen/pelvis. 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 Hemangioma protocol CT or MRI may be performed for more definitive characterization if indicated. Electronically Signed   By: Elberta Fortis M.D.   On: 10/19/2018 13:51     1400:  Pt has tol PO well while in the ED without N/V.  No stooling while in the ED.  Abd benign, VSS. Feels better and wants to go home now. Pt requesting a work note. Tx symptomatically at this time. Dx and testing d/w pt.  Questions answered.  Verb understanding, agreeable to d/c home with outpt f/u.      Final Clinical Impressions(s) / ED Diagnoses   Final diagnoses:  None    ED Discharge Orders    None       Samuel Jester, DO 10/22/18 1057

## 2018-10-19 NOTE — Discharge Instructions (Addendum)
Your CT scan showed an incidental finding: "1.3 cm hypervascular mass over the right lobe of the liver likely a hemangioma. Hemangioma protocol CT or MRI may be performed for more definitive characterization if indicated."  Take the prescription as directed.  Increase your fluid intake (ie:  Gatoraide) for the next few days.  Eat a bland diet and advance to your regular diet slowly as you can tolerate it. Call your regular medical doctor Monday to schedule a follow up appointment this week and to follow up your CT finding.  Return to the Emergency Department immediately sooner if worsening.

## 2018-10-20 ENCOUNTER — Telehealth: Payer: Self-pay | Admitting: Gastroenterology

## 2018-10-20 NOTE — Telephone Encounter (Signed)
Reviewed CT report. Likely benign liver hemangioma. Will need MRI to be sure.  Patient can be seen here, may use urgent BUT we do not have to see this week.

## 2018-10-20 NOTE — Telephone Encounter (Signed)
Pt seen in the ER yesterday, He said a mass was seen on his liver and needed to be seen this week per ER doctor. Please advise if we can accept his as a new patient and do I need to use an URG spot.

## 2018-10-21 NOTE — Telephone Encounter (Signed)
Pt is aware of OV on 10/31 at 1130

## 2018-10-30 ENCOUNTER — Ambulatory Visit (INDEPENDENT_AMBULATORY_CARE_PROVIDER_SITE_OTHER): Payer: 59 | Admitting: Gastroenterology

## 2018-10-30 ENCOUNTER — Encounter: Payer: Self-pay | Admitting: Gastroenterology

## 2018-10-30 DIAGNOSIS — R1013 Epigastric pain: Secondary | ICD-10-CM | POA: Insufficient documentation

## 2018-10-30 DIAGNOSIS — K769 Liver disease, unspecified: Secondary | ICD-10-CM

## 2018-10-30 DIAGNOSIS — R160 Hepatomegaly, not elsewhere classified: Secondary | ICD-10-CM | POA: Diagnosis not present

## 2018-10-30 DIAGNOSIS — K2970 Gastritis, unspecified, without bleeding: Secondary | ICD-10-CM | POA: Insufficient documentation

## 2018-10-30 HISTORY — DX: Hepatomegaly, not elsewhere classified: R16.0

## 2018-10-30 HISTORY — DX: Epigastric pain: R10.13

## 2018-10-30 MED ORDER — PANTOPRAZOLE SODIUM 40 MG PO TBEC: 40.0000 mg | DELAYED_RELEASE_TABLET | Freq: Every day | ORAL | 3 refills | Status: DC

## 2018-10-30 MED ORDER — DIAZEPAM 10 MG PO TABS: 10.0000 mg | ORAL_TABLET | Freq: Once | ORAL | 0 refills | Status: AC

## 2018-10-30 NOTE — Patient Instructions (Addendum)
1. Please have your MRI as scheduled. We will be in touch with results as available.  2. Take Valium 10mg  with sip of water, one hour prior to your MRI. You will need a driver.  3. Please increase water consumption to keep urine light yellow.  4. We will need to check your kidney labs 3 days prior to your MRI.  5. Take pantoprazole once daily 30 minutes before breakfast for your stomach pain. Take at least for one month. If 100% improved after one month, then stop medication. If better but not 100% better, then take for 3 months. If no improvement in your abdominal pain after 2 weeks, please call me.

## 2018-10-30 NOTE — Progress Notes (Signed)
Primary Care Physician:  Kerri Perches, MD  Primary Gastroenterologist:  Jonette Eva, MD   Chief Complaint  Patient presents with  . other    Mass on liver    HPI:  Tony Pitts is a 22 y.o. male here at the request of the ED physician for further evaluation of liver mass.  Patient was seen in the ED on October 20 for lower abdominal pain and nausea.  CT abdomen and pelvis with contrast obtained which showed 1.3 cm rounded hypervascular mass over the posterior segment right lobe of the liver likely hemangioma, otherwise unremarkable.  Hemangioma protocol CT or MRI recommended.  Patient states he started having upper abdominal pain, crampy feeling after he drank too much etoh for his birthday. Didn't have any problems before that. Really no heartburn.  Symptoms seem to be unrelated to meals.  Comes and goes.  Improved since he was seen in the ED 2 weeks ago.  No further vomiting.  Bowel movements are regular.  No blood in stool or melena.  Abdominal pain seems to be worse in the morning.   Not much of a hydrater. Dark urine.  Creatinine normal on October 13, up to 1.31.  Has been elevated various times in the past.    Current Outpatient Medications  Medication Sig Dispense Refill  . acetaminophen (TYLENOL) 500 MG tablet Take 500 mg by mouth every 6 (six) hours as needed for mild pain or moderate pain.    Marland Kitchen albuterol (PROVENTIL HFA;VENTOLIN HFA) 108 (90 Base) MCG/ACT inhaler Inhale 1-2 puffs into the lungs every 6 (six) hours as needed for wheezing or shortness of breath.    . diazepam (VALIUM) 10 MG tablet Take 1 tablet (10 mg total) by mouth once for 1 dose. Take one hour prior to scheduled MRI. 1 tablet 0  . pantoprazole (PROTONIX) 40 MG tablet Take 1 tablet (40 mg total) by mouth daily before breakfast. 30 tablet 3   No current facility-administered medications for this visit.     Allergies as of 10/30/2018  . (No Known Allergies)    Past Medical History:  Diagnosis  Date  . Kidney stone   . Tobacco abuse 12/28/2016    Past Surgical History:  Procedure Laterality Date  . ANTERIOR CRUCIATE LIGAMENT REPAIR      Family History  Problem Relation Age of Onset  . Cancer Mother        breast  . Asthma Sister   . Lung cancer Maternal Grandfather   . Colon cancer Other   . Throat cancer Other   . Cirrhosis Other        alcoholic    Social History   Socioeconomic History  . Marital status: Single    Spouse name: Not on file  . Number of children: 0  . Years of education: 41  . Highest education level: Not on file  Occupational History  . Occupation: Naval architect  . Occupation: Consulting civil engineer    CommentClinical cytogeneticist  Social Needs  . Financial resource strain: Not on file  . Food insecurity:    Worry: Not on file    Inability: Not on file  . Transportation needs:    Medical: Not on file    Non-medical: Not on file  Tobacco Use  . Smoking status: Current Every Day Smoker    Packs/day: 0.25    Types: Cigarettes  . Smokeless tobacco: Never Used  . Tobacco comment: 1-2 black and milds daily  Substance and Sexual Activity  .  Alcohol use: Yes    Comment: socially, denies heavy use except for recent birthday  . Drug use: Not Currently    Types: Marijuana  . Sexual activity: Yes    Birth control/protection: Condom  Lifestyle  . Physical activity:    Days per week: Not on file    Minutes per session: Not on file  . Stress: Not on file  Relationships  . Social connections:    Talks on phone: Not on file    Gets together: Not on file    Attends religious service: Not on file    Active member of club or organization: Not on file    Attends meetings of clubs or organizations: Not on file    Relationship status: Not on file  . Intimate partner violence:    Fear of current or ex partner: Not on file    Emotionally abused: Not on file    Physically abused: Not on file    Forced sexual activity: Not on file  Other Topics Concern  . 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 Eyes: Negative for vision changes.  ENT: Negative for hoarseness, difficulty swallowing , nasal congestion. CV: Negative for chest pain, angina, palpitations, dyspnea on exertion, peripheral edema.  Respiratory: Negative for dyspnea at rest, dyspnea on exertion, cough, sputum, wheezing.  GI: See history of present illness. GU:  Negative for dysuria, hematuria, urinary incontinence, urinary frequency, nocturnal urination.  MS: Negative for joint pain, low back pain.  Derm: Negative for rash or itching.  Neuro: Negative for weakness, abnormal sensation, seizure, frequent headaches, memory loss, confusion.  Psych: Negative for anxiety, depression, suicidal ideation, hallucinations.  Endo: Negative for unusual weight change.  Heme: Negative for bruising or bleeding. Allergy: Negative for rash or hives.    Physical Examination:  BP 114/69   Pulse 85   Temp 98.8 F (37.1 C) (Oral)   Ht 6\' 2"  (1.88 m)   Wt 201 lb 6.4 oz (91.4 kg)   BMI 25.86 kg/m    General: Well-nourished, well-developed in no acute distress.  Head: Normocephalic, atraumatic.   Eyes: Conjunctiva pink, no icterus. Mouth: Oropharyngeal mucosa moist and pink , no lesions erythema or exudate. Neck: Supple without thyromegaly, masses, or lymphadenopathy.  Lungs: Clear to auscultation bilaterally.  Heart: Regular rate and rhythm, no murmurs rubs or gallops.  Abdomen: Bowel sounds are normal, nontender, nondistended, no hepatosplenomegaly or masses, no abdominal bruits or    hernia , no rebound or guarding.   Rectal: Not performed Extremities: No lower extremity edema. No clubbing or deformities.  Neuro: Alert and oriented x 4 , grossly normal neurologically.  Skin: Warm and dry, no rash or  jaundice.   Psych: Alert and cooperative, normal mood and affect.  Labs: Lab Results  Component Value Date   CREATININE 1.31 (H) 10/19/2018   BUN 12 10/19/2018   NA 138 10/19/2018   K 4.5 10/19/2018   CL 106 10/19/2018   CO2 28 10/19/2018   Lab Results  Component Value Date   WBC 4.5 10/19/2018   HGB 15.0 10/19/2018   HCT 46.0 10/19/2018   MCV 93.1 10/19/2018   PLT 217 10/19/2018   Lab Results  Component Value Date   LIPASE 24 10/19/2018   Lab Results  Component Value  Date   ALT 18 10/19/2018   AST 21 10/19/2018   ALKPHOS 49 10/19/2018   BILITOT 0.8 10/19/2018     Imaging Studies: Ct Abdomen Pelvis W Contrast  Result Date: 10/19/2018 CLINICAL DATA:  Lower abdominal pain beginning this week with nausea. EXAM: CT ABDOMEN AND PELVIS WITH CONTRAST TECHNIQUE: Multidetector CT imaging of the abdomen and pelvis was performed using the standard protocol following bolus administration of intravenous contrast. CONTRAST:  ISOVUE-300 IOPAMIDOL (ISOVUE-300) INJECTION 61% COMPARISON:  03/12/2017 FINDINGS: Lower chest: Normal. Hepatobiliary: Gallbladder and biliary tree are normal. There is a 1.3 cm rounded hypervascular mass over the posterior segment right lobe of the liver. Pancreas: Normal. Spleen: Normal. Adrenals/Urinary Tract: Adrenal glands are normal. Kidneys are normal in size without hydronephrosis or nephrolithiasis. Ureters and bladder are normal. Stomach/Bowel: Stomach and small bowel are within normal. Appendix is normal. Colon is normal. Vascular/Lymphatic: Normal. Reproductive: Normal. Other: No free fluid or focal inflammatory change. Musculoskeletal: Unremarkable. IMPRESSION: No acute findings in the abdomen/pelvis. 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 Hemangioma protocol CT or MRI may be performed for more definitive characterization if indicated. Electronically Signed   By: Elberta Fortis M.D.   On: 10/19/2018 13:51

## 2018-10-30 NOTE — Assessment & Plan Note (Signed)
Suspected hemangioma.  MRI is recommended by radiology.  Provided reassurance to patient and his mother today.

## 2018-10-30 NOTE — Assessment & Plan Note (Signed)
Suspect gastritis with recent significant alcohol use.  Patient aware that he should use alcohol in moderation.  Avoid excessive use.  He does not drink on a regular basis.  We will try pantoprazole 40 mg 30 minutes before breakfast for the next 30 days.  If 100% improved at that point he can stop the medication.  If improved but not quite resolved, then continue for 3 months.  If no improvement in his symptoms after 2 weeks of pantoprazole he will let me know.

## 2018-10-30 NOTE — Patient Instructions (Signed)
PA for MRI Liver submitted via [employer]. Case approved. PA# [id number], 10/30/18-01/28/19.

## 2018-10-30 NOTE — Progress Notes (Signed)
cc'ed to pcp °

## 2018-10-31 LAB — CREATININE, SERUM: Creat: 1.41 mg/dL — ABNORMAL HIGH (ref 0.60–1.35)

## 2018-11-03 ENCOUNTER — Telehealth: Payer: Self-pay | Admitting: Gastroenterology

## 2018-11-03 NOTE — Progress Notes (Signed)
I called Radiology, spoke with [facility] in the MRI dept. She is aware of pt's creatinine level.

## 2018-11-03 NOTE — Telephone Encounter (Signed)
Call patient back at [phone]

## 2018-11-03 NOTE — Telephone Encounter (Signed)
LMOM for a return call.  

## 2018-11-03 NOTE — Progress Notes (Signed)
LMOM for pt to call. 

## 2018-11-03 NOTE — Progress Notes (Signed)
PT is aware and will follow up with Dr. Lodema Hong.

## 2018-11-03 NOTE — Telephone Encounter (Signed)
See result note. Pt is aware of results.  

## 2018-11-05 ENCOUNTER — Ambulatory Visit (HOSPITAL_COMMUNITY)
Admission: RE | Admit: 2018-11-05 | Discharge: 2018-11-05 | Disposition: A | Discharge status: Home / Self Care | Payer: 59 | Source: Ambulatory Visit | Attending: Gastroenterology | Admitting: Gastroenterology

## 2018-11-05 DIAGNOSIS — K769 Liver disease, unspecified: Secondary | ICD-10-CM | POA: Insufficient documentation

## 2018-11-05 MED ORDER — GADOBUTROL 1 MMOL/ML IV SOLN
10.0000 mL | Freq: Once | INTRAVENOUS | Status: AC | PRN
  Administered 2018-11-05: 10 mL via INTRAVENOUS

## 2018-11-06 NOTE — Progress Notes (Signed)
PT is aware.

## 2019-01-01 ENCOUNTER — Telehealth: Payer: Self-pay

## 2019-01-01 NOTE — Progress Notes (Signed)
Tried to call and mail box is full.  

## 2019-01-01 NOTE — Progress Notes (Signed)
Letter mailed for pt to call.  

## 2019-01-01 NOTE — Telephone Encounter (Signed)
Noted. Thanks.

## 2019-01-01 NOTE — Telephone Encounter (Signed)
Pt called and is aware he needs to see PCP for creatinine. He will talk to them right away. They were suppose to be referring him to a specialist for that. He will make sure it gets checked.

## 2019-02-24 ENCOUNTER — Other Ambulatory Visit (HOSPITAL_COMMUNITY): Payer: Self-pay | Admitting: Nephrology

## 2019-02-24 ENCOUNTER — Other Ambulatory Visit: Payer: Self-pay | Admitting: Nephrology

## 2019-02-24 DIAGNOSIS — N183 Chronic kidney disease, stage 3 unspecified: Secondary | ICD-10-CM

## 2019-03-04 ENCOUNTER — Ambulatory Visit (HOSPITAL_COMMUNITY)
Admission: RE | Admit: 2019-03-04 | Discharge: 2019-03-04 | Disposition: A | Discharge status: Home / Self Care | Payer: 59 | Source: Ambulatory Visit | Attending: Nephrology | Admitting: Nephrology

## 2019-03-04 DIAGNOSIS — N183 Chronic kidney disease, stage 3 unspecified: Secondary | ICD-10-CM

## 2019-03-31 ENCOUNTER — Encounter: Payer: Self-pay | Admitting: Emergency Medicine

## 2019-03-31 ENCOUNTER — Other Ambulatory Visit: Payer: Self-pay

## 2019-03-31 ENCOUNTER — Emergency Department
Admission: EM | Admit: 2019-03-31 | Discharge: 2019-03-31 | Disposition: A | Discharge status: Home / Self Care | Payer: 59 | Attending: Student in an Organized Health Care Education/Training Program | Admitting: Student in an Organized Health Care Education/Training Program

## 2019-03-31 DIAGNOSIS — Z79899 Other long term (current) drug therapy: Secondary | ICD-10-CM | POA: Insufficient documentation

## 2019-03-31 DIAGNOSIS — F17210 Nicotine dependence, cigarettes, uncomplicated: Secondary | ICD-10-CM | POA: Insufficient documentation

## 2019-03-31 DIAGNOSIS — R05 Cough: Secondary | ICD-10-CM | POA: Insufficient documentation

## 2019-03-31 DIAGNOSIS — J029 Acute pharyngitis, unspecified: Secondary | ICD-10-CM | POA: Insufficient documentation

## 2019-03-31 DIAGNOSIS — R0981 Nasal congestion: Secondary | ICD-10-CM | POA: Insufficient documentation

## 2019-03-31 DIAGNOSIS — J45909 Unspecified asthma, uncomplicated: Secondary | ICD-10-CM | POA: Insufficient documentation

## 2019-03-31 DIAGNOSIS — R059 Cough, unspecified: Secondary | ICD-10-CM

## 2019-03-31 NOTE — ED Notes (Signed)
EDP at bedside  

## 2019-03-31 NOTE — ED Provider Notes (Signed)
[facility] Emergency Department Provider Note    First MD Initiated Contact with Patient 03/31/19 [phone]     (approximate)  I have reviewed the triage vital signs and the nursing notes.   HISTORY  Chief Complaint Cough and Sore Throat    HPI Tony Pitts is a 23 y.o. male presents the ER for 3 days of cough sore throat.  Denies any measured fevers but thinks that he has had chills.  Does have a history of seasonal allergies.  Endorses quite a bit of congestion nonpurulent congestion.  No recent antibiotics.  No history of asthma or bronchitis.  No nausea or vomiting.  No stomach upset.  No diarrhea.  Does not have any known sick contacts or covid19 exposures.  Past Medical History:  Diagnosis Date  . Kidney stone   . Tobacco abuse 12/28/2016   Family History  Problem Relation Age of Onset  . Cancer Mother        breast  . Asthma Sister   . Lung cancer Maternal Grandfather   . Colon cancer Other   . Throat cancer Other   . Cirrhosis Other        alcoholic   Past Surgical History:  Procedure Laterality Date  . ANTERIOR CRUCIATE LIGAMENT REPAIR     Patient Active Problem List   Diagnosis Date Noted  . 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 Abdominal pain, epigastric 10/30/2018  . Tobacco abuse 12/28/2016      Prior to Admission medications   Medication Sig Start Date End Date Taking? Authorizing Provider  acetaminophen (TYLENOL) 500 MG tablet Take 500 mg by mouth every 6 (six) hours as needed for mild pain or moderate pain.    [provider]  albuterol (PROVENTIL HFA;VENTOLIN HFA) 108 (90 Base) MCG/ACT inhaler Inhale 1-2 puffs into the lungs every 6 (six) hours as needed for wheezing or shortness of breath.    [provider]  pantoprazole (PROTONIX) 40 MG tablet Take 1 tablet (40 mg total) by mouth daily before breakfast. 10/30/18   Tiffany Kocher, PA-C    Allergies Patient has no known allergies.    Social  History Social History   Tobacco Use  . Smoking status: Current Every Day Smoker    Packs/day: 0.25    Types: Cigarettes  . Smokeless tobacco: Never Used  . Tobacco comment: 1-2 black and milds daily  Substance Use Topics  . Alcohol use: Yes    Comment: socially, denies heavy use except for recent birthday  . Drug use: Not Currently    Types: Marijuana    Review of Systems Patient denies headaches, rhinorrhea, blurry vision, numbness, shortness of breath, chest pain, edema, cough, abdominal pain, nausea, vomiting, diarrhea, dysuria, fevers, rashes or hallucinations unless otherwise stated above in HPI. ____________________________________________   PHYSICAL EXAM:  VITAL SIGNS: Vitals:   03/31/19 0758 03/31/19 0759  BP:  134/70  Pulse: 77   Resp: 18   Temp: 99.1 F (37.3 C)   SpO2: 98%     Constitutional: Alert and oriented. Well appearing and in no acute distress. Eyes: Conjunctivae are normal.  Head: Atraumatic. Nose: No congestion/rhinnorhea. Mouth/Throat: Mucous membranes are moist. Uvula midline, posterior pharynx cobblestoning  Neck: Painless ROM.  Cardiovascular:   Good peripheral circulation. Respiratory: Normal respiratory effort.  No retractions.  Gastrointestinal: Soft and nontender.  Musculoskeletal: No lower extremity tenderness .  No joint effusions. Neurologic:  Normal speech and language. No gross focal neurologic deficits are  appreciated.  Skin:  Skin is warm, dry and intact. No rash noted. Psychiatric: Mood and affect are normal. Speech and behavior are normal.  ____________________________________________   LABS (all labs ordered are listed, but only abnormal results are displayed)  No results found for this or any previous visit (from the past 24 hour(s)). ____________________________________________ ____________________________________________   PROCEDURES  Procedure(s) performed:  Procedures    Critical Care performed: no  ____________________________________________   INITIAL IMPRESSION / ASSESSMENT AND PLAN / ED COURSE  Pertinent labs & imaging results that were available during my care of the patient were reviewed by me and considered in my medical decision making (see chart for details).  DDX: allergies, strep, uri, covid, 19  Tony Pitts is a 23 y.o. who presents to the ED with sx as described above.  Patient's vital signs are stable and within normal limits including having no fever and no tachycardia.  PERC negative.  Low risk for ACS.   No worrisome travel or sick contacts that would suggest an increased risk of COVID-19.  This patient does not meet criteria for testing currently and I explained that in detail.  exam today is reassuring with no evidence of emergent medical condition that requires further work-up or evaluation or inpatient treatment.  Most clinically likely to be allergy induced symptoms.  I gave my usual and customary follow-up recommendations and return precautions and the patient understands and agrees with the plan.      The patient was evaluated in Emergency Department today for the symptoms described in the history of present illness. He/she was evaluated in the context of the global COVID-19 pandemic, which necessitated consideration that the patient might be at risk for infection with the SARS-CoV-2 virus that causes COVID-19. Institutional protocols and algorithms that pertain to the evaluation of patients at risk for COVID-19 are in a state of rapid change based on information released by regulatory bodies including the CDC and federal and state organizations. These policies and algorithms were followed during the patient's care in the ED.   ____________________________________________   FINAL CLINICAL IMPRESSION(S) / ED DIAGNOSES  Final diagnoses:  Sore throat  Cough      NEW MEDICATIONS STARTED DURING THIS VISIT:  New Prescriptions   No medications on file      Note:  This document was prepared using Dragon voice recognition software and may include unintentional dictation errors.     Willy Eddy, MD 03/31/19 0830

## 2019-03-31 NOTE — ED Triage Notes (Signed)
Developed cough sore throat and nasal congestion about 3 days ago  States cough has been prod at times  Low grade fever on arrival

## 2019-03-31 NOTE — Discharge Instructions (Signed)
You have a viral illness which can have symptoms like muscle aches, fevers, chills, runny nose, cough, sneezing, sore throat, vomiting or diarrhea. One of the viruses that can cause this is SARS- CoV-2, the virus that causes COVID-19, also known as the novel coronavirus. You could also have a different viral infection such as the common cold or flu. Most patients with viral illness including COVID-19 have mild symptoms and recover on their own. Resting, staying hydrated, and sleeping are helpful. Today we think you are well enough to go home and treat your symptoms with oral liquids, and medicine for fevers, cough, and pain. ° °We generally do not do COVID-19 testing on people with mild symptoms who are being discharged from the Emergency Department or Clinic.  ° °If we did a test for COVID-19 the results will not be available for several days. We will call you with the result. Please DO NOT CONTACT THE EMERGENCY DEPARTMENT OR CLINIC FOR RESULTS OF THIS TEST.  ° °Please follow the precautions below:  °Stay home for at least 7 days after your symptoms began OR for 3 days after your fever ends, whichever takes longer. ? ° °If people live with you they should also stay home and avoid contact with others for 14 days.? ° ° °ISOLATION GUIDANCE FOR POSSIBLE COVID °Most people with cough and fever have an illness caused by a virus. One is COVID-19. Not all people with these infections are being tested for the virus that causes COVID-19. People who might have COVID but are not being tested should still try to prevent the spread of the infection. ° °These instructions are modified recommendations from the [place] Department of Health and Human Services. ° °People who might have COVID-19 should follow the instructions below until a doctor or health department says they can stop. ° °Stay home unless you need to see a doctor °Stop doing things outside your home except for getting medical care. Do not go to work, school,  or public areas; and do not use public transportation or taxis. ° °Call ahead before visiting the doctor °Before your appointment, call the doctor's office and tell them about your symptoms. This will help them take steps to keep other people from getting infected.  ° °Keep track of your symptoms °Symptoms are the things you feel, like fever or trouble breathing. Go to your doctor or the ER if you think you are getting worse, like having more trouble breathing. Call the doctor's office and tell them about your symptoms. This will help them take steps to keep other people from getting sick.  ° °Wear a face mask °You should wear a face mask that covers your nose and mouth when you are in the same room with other people and when you visit the doctor's office. People who live with you or visit you should also wear a face mask when they are in the same room with you. ° °Separate yourself from other people in your home °You should stay in a different room from other people in your home. You should stay separate from your family members as much as possible. You should use a separate bathroom if you can. ° °Avoid sharing things in your house °Don't share dishes, drinking glasses, cups, eating utensils, towels, bedding, or other things with people in your home. After using these things please wash them really well with soap and water. ° °Cover your coughs and sneezes °Cover your mouth and nose with a tissue when you   cough or sneeze, or you can cough or sneeze into your sleeve. Throw used tissues in a trash can that has a bag in it, and immediately wash your hands with soap and water for at least 20 seconds. If you use an alcohol-based hand rub please rub your hands together for 20 seconds. ° °Wash your hands °Wash your hands often and very well with soap and water for at least 20 seconds. If your hands are not visibly dirty you can use an alcohol-based hand sanitizer. Don't touch your eyes, nose, or mouth with unwashed  hands. ° ° ° °Instructions for People Helping Care for Patients with Possible COVID-19 °Follow your doctor's instructions °Make sure that you understand and can help the patient follow any instructions for care. ° °Provide for the patient's basic needs °You should help the patient with basic needs in the home and provide support for getting groceries, prescriptions, and other personal needs. ° °Keep track of the patient's symptoms °If they are getting sicker, call his or her doctor. This will help the doctor's office take steps to keep other people from getting infected. ° °Limit the number of people who have contact with the patient °If possible, have only one caregiver for the patient. °Other family members should stay in another home or place of residence. If they can't, they should stay in another room and stay separated from the patient as much as possible. °Keep one bathroom JUST for the patient if you can.  °Only allow visitors if they MUST be in the home. ° °Keep older adults, very young children, and other sick people away from the patient °Keep older adults, very young children, and people who have compromised immune systems or chronic health conditions away from the patient. This includes people with chronic heart, lung, or kidney conditions, diabetes, and cancer. ° °Wash your hands often °Avoid touching your eyes, nose, and mouth with unwashed hands. °Wash your hands often and thoroughly with soap and water for at least 20 seconds. You can use an alcohol-based hand sanitizer if soap and water are not available and if your hands are not visibly dirty.  °Use disposable paper towels to dry your hands. If not available, use dedicated cloth towels and replace them when they become wet. ° °Avoid contamination from face masks and gloves °Wear a disposable face mask and gloves whenever you are touching the patient, things in their room or bathroom, or things that can have their body fluid on them, like bedding  or dishes, or blood, vomit, urine, or feces (poop).  °Ensure the mask fits over your nose and mouth tightly, and do not touch it at all while you are wearing it. °Throw out disposable facemasks and gloves after using them. Do not reuse. °Wash your hands immediately after removing your facemask and gloves. °If your personal clothing becomes dirty with a patient's body fluids, carefully remove clothing and launder. Wash your hands after handling dirty clothing. °Place all used disposable facemasks, gloves, and other waste in a lined container before disposing them with other household garbage.  °Remove gloves and wash your hands immediately after handling these items. ° °Do not share dishes, glasses, or other household items with the patient °Avoid sharing household items. You should not share dishes, drinking glasses, cups, eating utensils, towels, bedding, or other items with a patient who is confirmed to have, or being evaluated for, COVID-19 infection.  °After the person uses these items, you should wash them very well with soap and   water. ° °Wash laundry thoroughly °Immediately remove and wash clothes or bedding that have blood, body fluids, and/or secretions or excretions, such as sweat, saliva, sputum, nasal mucus, vomit, urine, or feces, on them.  °Wear gloves when handling laundry from the patient.  °Read and follow directions on labels of laundry or clothing items and detergent. In general, wash and dry with the warmest temperatures recommended on the label. ° °Clean all areas the individual has used  °Clean all touchable surfaces, such as counters, tabletops, doorknobs, bathroom fixtures, toilets, phones, keyboards, tablets, and bedside tables, every day. Also, clean any surfaces that may have blood, body fluids, and/or secretions or excretions on them. Wear gloves when cleaning surfaces the patient has come in contact with.  °Use a diluted bleach solution (dilute bleach with 1 part bleach and 10 parts  water) or a household disinfectant with a label that says EPA-registered for coronaviruses. To make a bleach solution at home, add 1 tablespoon of bleach to 1 quart (4 cups) of water. For a larger supply, add ¼ cup of bleach to 1 gallon (16 cups) of water.  °Read labels of cleaning products and follow recommendations provided on product labels. Labels contain instructions for safe and effective use of the cleaning product including precautions you should take when applying the product, such as wearing gloves or eye protection and making sure you have good ventilation during use of the product.  °Remove gloves and wash hands immediately after cleaning. ° °Monitor yourself for signs and symptoms of illness °Caregivers and household members are considered close contacts, should monitor their health, and will be asked to limit movement outside of the home as much as possible.  ° °If you have additional questions °Contact  °Your Doctor or Healthcare Provider °The [facility] website (http://www.[facility].edu/coronavirus) °The [facility] COVID hotline 336-70COVID °Your local health department  ° °This guidance is subject to change. For the most up to date guidance, check the state Department of Health website at [facility]CD[facility].GOV ° °

## 2019-04-22 ENCOUNTER — Emergency Department
Admission: EM | Admit: 2019-04-22 | Discharge: 2019-04-22 | Disposition: A | Discharge status: Home / Self Care | Payer: 59 | Attending: Emergency Medicine | Admitting: Emergency Medicine

## 2019-04-22 ENCOUNTER — Other Ambulatory Visit: Payer: Self-pay

## 2019-04-22 DIAGNOSIS — K529 Noninfective gastroenteritis and colitis, unspecified: Secondary | ICD-10-CM | POA: Diagnosis not present

## 2019-04-22 DIAGNOSIS — R109 Unspecified abdominal pain: Secondary | ICD-10-CM | POA: Diagnosis present

## 2019-04-22 LAB — URINALYSIS, COMPLETE (UACMP) WITH MICROSCOPIC
Bacteria, UA: NONE SEEN
Bilirubin Urine: NEGATIVE
Glucose, UA: NEGATIVE mg/dL
Hgb urine dipstick: NEGATIVE
Ketones, ur: NEGATIVE mg/dL
Nitrite: NEGATIVE
Protein, ur: NEGATIVE mg/dL
Specific Gravity, Urine: 1.025 (ref 1.005–1.030)
pH: 6 (ref 5.0–8.0)

## 2019-04-22 LAB — COMPREHENSIVE METABOLIC PANEL
ALT: 16 U/L (ref 0–44)
AST: 20 U/L (ref 15–41)
Albumin: 4.4 g/dL (ref 3.5–5.0)
Alkaline Phosphatase: 71 U/L (ref 38–126)
Anion gap: 6 (ref 5–15)
BUN: 17 mg/dL (ref 6–20)
CO2: 26 mmol/L (ref 22–32)
Calcium: 9.4 mg/dL (ref 8.9–10.3)
Chloride: 108 mmol/L (ref 98–111)
Creatinine, Ser: 1.23 mg/dL (ref 0.61–1.24)
GFR calc Af Amer: >60 mL/min (ref >60)
GFR calc non Af Amer: >60 mL/min (ref >60)
Glucose, Bld: 103 mg/dL — ABNORMAL HIGH (ref 70–99)
Potassium: 4.2 mmol/L (ref 3.5–5.1)
Sodium: 140 mmol/L (ref 135–145)
Total Bilirubin: 0.6 mg/dL (ref 0.3–1.2)
Total Protein: 7.4 g/dL (ref 6.5–8.1)

## 2019-04-22 LAB — CBC
HCT: 45.1 % (ref 39.0–52.0)
Hemoglobin: 15 g/dL (ref 13.0–17.0)
MCH: 31.6 pg (ref 26.0–34.0)
MCHC: 33.3 g/dL (ref 30.0–36.0)
MCV: 95.1 fL (ref 80.0–100.0)
Platelets: 239 10*3/uL (ref 150–400)
RBC: 4.74 MIL/uL (ref 4.22–5.81)
RDW: 13 % (ref 11.5–15.5)
WBC: 5.7 10*3/uL (ref 4.0–10.5)
nRBC: 0 % (ref 0.0–0.2)

## 2019-04-22 LAB — LIPASE, BLOOD: Lipase: 75 U/L — ABNORMAL HIGH (ref 11–51)

## 2019-04-22 MED ORDER — ONDANSETRON 4 MG PO TBDP: 4.0000 mg | ORAL_TABLET | Freq: Three times a day (TID) | ORAL | 0 refills | Status: DC | PRN

## 2019-04-22 MED ORDER — OXYCODONE-ACETAMINOPHEN 5-325 MG PO TABS: 1.0000 | ORAL_TABLET | Freq: Three times a day (TID) | ORAL | 0 refills | Status: DC | PRN

## 2019-04-22 NOTE — ED Triage Notes (Signed)
Nausea, emesis and abdominal pain after eating since Sunday. Generalized cramping. Diarrhea. Pt alert and oriented X4, active, cooperative, pt in NAD. RR even and unlabored, color WNL.

## 2019-04-22 NOTE — ED Notes (Signed)
Pt reports he tolerated crackers and water this morning before coming. Pt given water at this time.

## 2019-04-22 NOTE — ED Provider Notes (Addendum)
[facility] Emergency Department Provider Note       Time seen: ----------------------------------------- 8:47 AM on 04/22/2019 -----------------------------------------   I have reviewed the triage vital signs and the nursing notes.  HISTORY   Chief Complaint Abdominal Pain    HPI Tony Pitts is a 23 y.o. male with a history of kidney stones, liver mass, epigastric pain who presents to the ED for nausea and vomiting with abdominal pain after eating since Sunday.  He complains of a generalized cramping with diarrhea.  He denies fevers, chills or other complaints.  Past Medical History:  Diagnosis Date  . Kidney stone   . Tobacco abuse 12/28/2016    Patient Active Problem List   Diagnosis Date Noted  . 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 Abdominal pain, epigastric 10/30/2018  . Tobacco abuse 12/28/2016    Past Surgical History:  Procedure Laterality Date  . ANTERIOR CRUCIATE LIGAMENT REPAIR      Allergies Patient has no known allergies.  Social History Social History   Tobacco Use  . Smoking status: Current Every Day Smoker    Packs/day: 0.25    Types: Cigarettes  . Smokeless tobacco: Never Used  . Tobacco comment: 1-2 black and milds daily  Substance Use Topics  . Alcohol use: Yes    Comment: socially, denies heavy use except for recent birthday  . Drug use: Not Currently    Types: Marijuana   Review of Systems Constitutional: Negative for fever. Cardiovascular: Negative for chest pain. Respiratory: Negative for shortness of breath. Gastrointestinal: Positive for abdominal pain, vomiting and diarrhea Musculoskeletal: Negative for back pain. Skin: Negative for rash. Neurological: Negative for headaches, focal weakness or numbness.  All systems negative/normal/unremarkable except as stated in the HPI  ____________________________________________   PHYSICAL EXAM:  VITAL SIGNS: ED Triage Vitals  Enc Vitals Group      BP 04/22/19 0822 134/70     Pulse Rate 04/22/19 0822 69     Resp 04/22/19 0822 18     Temp 04/22/19 0822 98.4 F (36.9 C)     Temp Source 04/22/19 0822 Oral     SpO2 04/22/19 0822 99 %     Weight 04/22/19 0821 202 lb 13.2 oz (92 kg)     Height 04/22/19 0821 6\' 2"  (1.88 m)     Head Circumference --      Peak Flow --      Pain Score 04/22/19 0821 4     Pain Loc --      Pain Edu? --      Excl. in GC? --    Constitutional: Alert and oriented. Well appearing and in no distress. Eyes: Conjunctivae are normal. Normal extraocular movements. Cardiovascular: Normal rate, regular rhythm. No murmurs, rubs, or gallops. Respiratory: Normal respiratory effort without tachypnea nor retractions. Breath sounds are clear and equal bilaterally. No wheezes/rales/rhonchi. Gastrointestinal: Soft and nontender.  Hyperactive bowel sounds are noted Musculoskeletal: Nontender with normal range of motion in extremities. No lower extremity tenderness nor edema. Neurologic:  Normal speech and language. No gross focal neurologic deficits are appreciated.  Skin:  Skin is warm, dry and intact. No rash noted. Psychiatric: Mood and affect are normal. Speech and behavior are normal.  ____________________________________________  ED COURSE:  As part of my medical decision making, I reviewed the following data within the electronic MEDICAL RECORD NUMBER History obtained from family if available, nursing notes, old chart and ekg, as well as notes from prior ED visits. Patient presented for  symptoms of vomiting and diarrhea, we will assess with labs and imaging as indicated at this time.   Procedures  Adel D Langstaff [facility] on [facility]2019 for the symptoms described in the history of present illness. He was evaluated in the context of the global COVID-19 pandemic, which necessitated consideration that the patient might be at risk for infection with the SARS-CoV-2 virus that causes COVID-19.  Institutional protocols and algorithms that pertain to the evaluation of patients at risk for COVID-19 are in a state of rapid change based on information released by regulatory bodies including the CDC and federal and state organizations. These policies and algorithms were followed during the patient's care in the ED.  ____________________________________________   LABS (pertinent positives/negatives)  Labs Reviewed  LIPASE, BLOOD - Abnormal; Notable for the following components:      Result Value   Lipase 75 (*)    All other components within normal limits  COMPREHENSIVE METABOLIC PANEL - Abnormal; Notable for the following components:   Glucose, Bld 103 (*)    All other components within normal limits  URINALYSIS, COMPLETE (UACMP) WITH MICROSCOPIC - Abnormal; Notable for the following components:   Color, Urine YELLOW (*)    APPearance CLEAR (*)    Leukocytes,Ua SMALL (*)    All other components within normal limits  CBC   ____________________________________________   DIFFERENTIAL DIAGNOSIS   Gastroenteritis, dehydration, electrolyte abnormality  FINAL ASSESSMENT AND PLAN  Gastroenteritis   Plan: The patient had presented for abdominal pain with vomiting and diarrhea. Patient's labs are reassuring and likely mild lipase elevation from gastroenteritis.  He was able to tolerate liquids.  He will be discharged with antiemetics and encouraged to take Imodium for diarrhea.  He is cleared for outpatient follow-up.   Ulice DashJohnathan E Sarh Kirschenbaum, MD    Note: This note was generated in part or whole with voice recognition software. Voice recognition is usually quite accurate but there are transcription errors that can and very often do occur. I apologize for any typographical errors that were not detected and corrected.     Emily FilbertWilliams, Jaydi Bray E, MD 04/22/19 [id number]    Emily FilbertWilliams, Hisao Doo E, MD 04/22/19 [phone]

## 2019-04-27 ENCOUNTER — Other Ambulatory Visit: Payer: Self-pay

## 2019-04-27 ENCOUNTER — Emergency Department
Admission: EM | Admit: 2019-04-27 | Discharge: 2019-04-27 | Disposition: A | Discharge status: Home / Self Care | Payer: 59 | Attending: Emergency Medicine | Admitting: Emergency Medicine

## 2019-04-27 DIAGNOSIS — R112 Nausea with vomiting, unspecified: Secondary | ICD-10-CM | POA: Diagnosis present

## 2019-04-27 DIAGNOSIS — K529 Noninfective gastroenteritis and colitis, unspecified: Secondary | ICD-10-CM | POA: Diagnosis not present

## 2019-04-27 DIAGNOSIS — F17210 Nicotine dependence, cigarettes, uncomplicated: Secondary | ICD-10-CM | POA: Diagnosis not present

## 2019-04-27 DIAGNOSIS — R55 Syncope and collapse: Secondary | ICD-10-CM | POA: Diagnosis not present

## 2019-04-27 LAB — CBC WITH DIFFERENTIAL/PLATELET
Abs Immature Granulocytes: 0.03 10*3/uL (ref 0.00–0.07)
Basophils Absolute: 0 10*3/uL (ref 0.0–0.1)
Basophils Relative: 0 %
Eosinophils Absolute: 0 10*3/uL (ref 0.0–0.5)
Eosinophils Relative: 0 %
HCT: 44.4 % (ref 39.0–52.0)
Hemoglobin: 14.6 g/dL (ref 13.0–17.0)
Immature Granulocytes: 0 %
Lymphocytes Relative: 16 %
Lymphs Abs: 1.5 10*3/uL (ref 0.7–4.0)
MCH: 31.4 pg (ref 26.0–34.0)
MCHC: 32.9 g/dL (ref 30.0–36.0)
MCV: 95.5 fL (ref 80.0–100.0)
Monocytes Absolute: 0.5 10*3/uL (ref 0.1–1.0)
Monocytes Relative: 5 %
Neutro Abs: 7 10*3/uL (ref 1.7–7.7)
Neutrophils Relative %: 79 %
Platelets: 199 10*3/uL (ref 150–400)
RBC: 4.65 MIL/uL (ref 4.22–5.81)
RDW: 12.9 % (ref 11.5–15.5)
WBC: 9 10*3/uL (ref 4.0–10.5)
nRBC: 0 % (ref 0.0–0.2)

## 2019-04-27 LAB — COMPREHENSIVE METABOLIC PANEL
ALT: 18 U/L (ref 0–44)
AST: 27 U/L (ref 15–41)
Albumin: 4.3 g/dL (ref 3.5–5.0)
Alkaline Phosphatase: 47 U/L (ref 38–126)
Anion gap: 7 (ref 5–15)
BUN: 14 mg/dL (ref 6–20)
CO2: 24 mmol/L (ref 22–32)
Calcium: 8.9 mg/dL (ref 8.9–10.3)
Chloride: 107 mmol/L (ref 98–111)
Creatinine, Ser: 1.35 mg/dL — ABNORMAL HIGH (ref 0.61–1.24)
GFR calc Af Amer: >60 mL/min (ref >60)
GFR calc non Af Amer: >60 mL/min (ref >60)
Glucose, Bld: 88 mg/dL (ref 70–99)
Potassium: 4.2 mmol/L (ref 3.5–5.1)
Sodium: 138 mmol/L (ref 135–145)
Total Bilirubin: 0.8 mg/dL (ref 0.3–1.2)
Total Protein: 7.1 g/dL (ref 6.5–8.1)

## 2019-04-27 LAB — LIPASE, BLOOD: Lipase: 20 U/L (ref 11–51)

## 2019-04-27 MED ORDER — DICYCLOMINE HCL 10 MG PO CAPS
10.0000 mg | ORAL_CAPSULE | Freq: Once | ORAL | Status: AC
  Administered 2019-04-27: 10 mg via ORAL
  Filled 2019-04-27: qty 1

## 2019-04-27 MED ORDER — ONDANSETRON HCL 4 MG/2ML IJ SOLN
4.0000 mg | Freq: Once | INTRAMUSCULAR | Status: AC
  Administered 2019-04-27: 4 mg via INTRAVENOUS
  Filled 2019-04-27: qty 2

## 2019-04-27 MED ORDER — DICYCLOMINE HCL 10 MG PO CAPS: 10.0000 mg | ORAL_CAPSULE | Freq: Three times a day (TID) | ORAL | 0 refills | Status: DC | PRN

## 2019-04-27 MED ORDER — SODIUM CHLORIDE 0.9 % IV BOLUS
1000.0000 mL | Freq: Once | INTRAVENOUS | Status: AC
  Administered 2019-04-27: 1000 mL via INTRAVENOUS

## 2019-04-27 NOTE — ED Provider Notes (Signed)
[facility] Emergency Department Provider Note ____________________________________________   First MD Initiated Contact with Patient 04/27/19 1022     (approximate)  I have reviewed the triage vital signs and the nursing notes.   HISTORY  Chief Complaint Emesis    HPI Tony Pitts is a 23 y.o. male with PMH as noted below who presents with nausea and vomiting, acute onset this morning, and associate with crampy abdominal pain.  The patient states that he was feeling fine when he awoke this morning.  He went to work and then suddenly had a severe cramp in his periumbilical area.  He then became nauseous and vomited twice.  He states that at around this time he started to feel very lightheaded and weak, began sweating profusely, and felt like he needed to sit down.  The symptoms have improved.  The patient reports diarrhea over the last few days.  He states that he initially was sick about 5 days ago with similar symptoms and came here.  He was diagnosed with gastroenteritis.  He states that he felt better the next day and was feeling well until this morning.  He denies any unusual foods or any recent heavy alcohol or marijuana use.   Past Medical History:  Diagnosis Date  . Kidney stone   . Tobacco abuse 12/28/2016    Patient Active Problem List   Diagnosis Date Noted  . 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 Abdominal pain, epigastric 10/30/2018  . Tobacco abuse 12/28/2016    Past Surgical History:  Procedure Laterality Date  . ANTERIOR CRUCIATE LIGAMENT REPAIR      Prior to Admission medications   Medication Sig Start Date End Date Taking? Authorizing Provider  Multiple Vitamin (MULTIVITAMIN) tablet Take 1 tablet by mouth daily.   Yes [provider]  ondansetron (ZOFRAN ODT) 4 MG disintegrating tablet Take 1 tablet (4 mg total) by mouth every 8 (eight) hours as needed for nausea or vomiting. 04/22/19  Yes Emily FilbertWilliams, Jonathan E, MD   oxyCODONE-acetaminophen (PERCOCET) 5-325 MG tablet Take 1 tablet by mouth every 8 (eight) hours as needed. 04/22/19  Yes Emily FilbertWilliams, Jonathan E, MD  dicyclomine (BENTYL) 10 MG capsule Take 1 capsule (10 mg total) by mouth 3 (three) times daily as needed for up to 5 days for spasms. 04/27/19 05/02/19  Dionne BucySiadecki, Demetrios Byron, MD    Allergies Patient has no known allergies.  Family History  Problem Relation Age of Onset  . Cancer Mother        breast  . Asthma Sister   . Lung cancer Maternal Grandfather   . Colon cancer Other   . Throat cancer Other   . Cirrhosis Other        alcoholic    Social History Social History   Tobacco Use  . Smoking status: Current Every Day Smoker    Packs/day: 0.25    Types: Cigarettes  . Smokeless tobacco: Never Used  . Tobacco comment: 1-2 black and milds daily  Substance Use Topics  . Alcohol use: Yes    Comment: socially, denies heavy use except for recent birthday  . Drug use: Not Currently    Types: Marijuana    Review of Systems  Constitutional: No fever. Eyes: No redness. ENT: No sore throat. Cardiovascular: Denies chest pain. Respiratory: Denies shortness of breath. Gastrointestinal: Positive for nausea and vomiting. Genitourinary: Negative for dysuria.  Musculoskeletal: Negative for back pain. Skin: Negative for rash. Neurological: Negative for headache.   ____________________________________________  PHYSICAL EXAM:  VITAL SIGNS: ED Triage Vitals  Enc Vitals Group     BP 04/27/19 1017 126/71     Pulse Rate 04/27/19 1017 (!) 42     Resp 04/27/19 1017 18     Temp 04/27/19 1017 98 F (36.7 C)     Temp Source 04/27/19 1017 Oral     SpO2 04/27/19 1017 99 %     Weight 04/27/19 1019 200 lb (90.7 kg)     Height 04/27/19 1019 6\' 2"  (1.88 m)     Head Circumference --      Peak Flow --      Pain Score 04/27/19 1019 4     Pain Loc --      Pain Edu? --      Excl. in GC? --     Constitutional: Alert and oriented.  Relatively  well appearing and in no acute distress. Eyes: Conjunctivae are normal.  Head: Atraumatic. Nose: No congestion/rhinnorhea. Mouth/Throat: Mucous membranes are slightly dry.   Neck: Normal range of motion.  Cardiovascular: Normal rate, regular rhythm. Grossly normal heart sounds.  Good peripheral circulation. Respiratory: Normal respiratory effort.  No retractions.  Gastrointestinal: Soft with mild discomfort to palpation but no focal tenderness.  No distention.  Genitourinary: No flank tenderness. Musculoskeletal: No lower extremity edema.  Extremities warm and well perfused.  Neurologic:  Normal speech and language. No gross focal neurologic deficits are appreciated.  Skin:  Skin is warm and dry. No rash noted. Psychiatric: Mood and affect are normal. Speech and behavior are normal.  ____________________________________________   LABS (all labs ordered are listed, but only abnormal results are displayed)  Labs Reviewed  COMPREHENSIVE METABOLIC PANEL - Abnormal; Notable for the following components:      Result Value   Creatinine, Ser 1.35 (*)    All other components within normal limits  CBC WITH DIFFERENTIAL/PLATELET  LIPASE, BLOOD  URINALYSIS, COMPLETE (UACMP) WITH MICROSCOPIC   ____________________________________________  EKG  ED ECG REPORT I, Dionne Bucy, the attending physician, personally viewed and interpreted this ECG.  Date: 04/27/2019 EKG Time: 1102 Rate: 60 Rhythm: normal sinus rhythm QRS Axis: normal Intervals: normal ST/T Wave abnormalities: Benign early repolarization Narrative Interpretation: no evidence of acute ischemia  ____________________________________________  RADIOLOGY    ____________________________________________   PROCEDURES  Procedure(s) performed: No  Procedures  Critical Care performed: No ____________________________________________   INITIAL IMPRESSION / ASSESSMENT AND PLAN / ED COURSE  Pertinent labs & imaging  results that were available during my care of the patient were reviewed by me and considered in my medical decision making (see chart for details).  23 year old male with PMH as noted above presents with recurrence of nausea and vomiting this morning as well as a resulting episode of near syncope.  The patient has had some crampy abdominal pain and diarrhea as well.  I reviewed the past medical records in Epic.  The patient was seen in the ED on 04/22/2019 with nausea, vomiting, and abdominal pain and diagnosed with gastroenteritis.  The patient states that he was feeling well from the day after the ED visit until early this morning.  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  On exam today the patient is relatively well-appearing.  His vital signs are normal except for borderline low heart rate although this is not unusual for a young and  relatively athletic appearing patient.  He has slightly dry mucous membranes.  Abdomen is soft with no focal tenderness.  Overall presentation is most consistent with recurrent/persistent gastroenteritis.  Differential also includes gastritis or less likely pancreatitis or other hepatobiliary cause.  I have a low suspicion for surgical etiology.  It appears that patient had a vasovagal near syncope after the onset of the cramping pain.  We will obtain lab work-up and treat symptomatically with Zofran and Bentyl as well as a fluid bolus.  At this time, there is no indication for imaging however if there are concerning lab findings or persistent pain or vomiting will consider it.  ----------------------------------------- 12:56 PM on 04/27/2019 -----------------------------------------  The lab work-up is unremarkable.  The patient continues to appear well, and on reassessment he states that his symptoms have almost completely  resolved.  At this time, he feels comfortable going home.  There continues to be no indication for imaging.  In addition to the medicines from the previous visit, I will prescribe Bentyl for symptomatic treatment.  I counseled him on the results of the work-up and the plan of care.  Return precautions given, and the patient expressed understanding.  ____________________________________________   FINAL CLINICAL IMPRESSION(S) / ED DIAGNOSES  Final diagnoses:  Gastroenteritis  Near syncope      NEW MEDICATIONS STARTED DURING THIS VISIT:  New Prescriptions   DICYCLOMINE (BENTYL) 10 MG CAPSULE    Take 1 capsule (10 mg total) by mouth 3 (three) times daily as needed for up to 5 days for spasms.     Note:  This document was prepared using Dragon voice recognition software and may include unintentional dictation errors.    Dionne Bucy, MD 04/27/19 1257

## 2019-04-27 NOTE — ED Triage Notes (Signed)
Pt arrived via EMS for vomiting today x 2, no diarrhea. PT was seen last week for this and was better over the weekend but symptoms returned today. Reports weakness, no dizziness

## 2019-04-27 NOTE — ED Notes (Signed)
Pt resting in bed, states stomach pain has eased up a little, nausea has improved, will ctm, bed locked and in lowest position, call bell in reach

## 2019-04-27 NOTE — ED Notes (Signed)
Pt resting in bed, still unable to provide urine sample

## 2019-04-27 NOTE — Discharge Instructions (Addendum)
Return to the ER for new, worsening, or persistent severe abdominal pain, vomiting, fever, weakness, or any other new or worsening symptoms that concern you.  In addition to the medicines you were prescribed at the last visit, you can take the Bentyl prescribed today as needed for abdominal cramps.

## 2019-07-30 ENCOUNTER — Emergency Department
Admission: EM | Admit: 2019-07-30 | Discharge: 2019-07-30 | Disposition: A | Discharge status: Home / Self Care | Payer: PRIVATE HEALTH INSURANCE | Attending: Emergency Medicine | Admitting: Emergency Medicine

## 2019-07-30 ENCOUNTER — Emergency Department: Payer: PRIVATE HEALTH INSURANCE

## 2019-07-30 ENCOUNTER — Other Ambulatory Visit: Payer: Self-pay

## 2019-07-30 ENCOUNTER — Encounter: Payer: Self-pay | Admitting: Emergency Medicine

## 2019-07-30 DIAGNOSIS — F17210 Nicotine dependence, cigarettes, uncomplicated: Secondary | ICD-10-CM | POA: Insufficient documentation

## 2019-07-30 DIAGNOSIS — M25511 Pain in right shoulder: Secondary | ICD-10-CM | POA: Diagnosis present

## 2019-07-30 MED ORDER — NAPROXEN 500 MG PO TABS: 500.0000 mg | ORAL_TABLET | Freq: Two times a day (BID) | ORAL | Status: DC

## 2019-07-30 NOTE — ED Triage Notes (Signed)
Pt reports injured his shoulder somehow 2 weeks ago and still having pain to his shoulder. Right shoulder with full ROM but painful.

## 2019-07-30 NOTE — ED Notes (Signed)
Pt states that two weeks ago, he woke up and his right shoulder was throbbing. Pt states that he went to the gym and work but the day before, but he doesn't recall injuring his shoulder or feeling any pain. Pt is able to lift his arm, but feels pain and stiffness when he does.

## 2019-07-30 NOTE — Discharge Instructions (Signed)
Follow discharge care instruction using heat instead of ice.  Take medication as directed. °

## 2019-07-30 NOTE — ED Provider Notes (Signed)
[facility] Emergency Department Provider Note   ____________________________________________   First MD Initiated Contact with Patient 07/30/19 [phone]     (approximate)  I have reviewed the triage vital signs and the nursing notes.   HISTORY  Chief Complaint Shoulder Injury and Shoulder Pain    HPI Tony Pitts is a 23 y.o. male patient presents with 2 weeks of nontraumatic right shoulder pain.  Patient state has full and equal range of motion through pain.  [facility].  Patient states his job requires repetitive heavy lifting and he does workout at the gym.  Patient state has decreased his gym workouts secondary to pain.  Patient rates his pain as a 4/10.  Patient described the pain is "achy".  No palliative measure for complaint.         Past Medical History:  Diagnosis Date  . Kidney stone   . Tobacco abuse 12/28/2016    Patient Active Problem List   Diagnosis Date Noted  . 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 Abdominal pain, epigastric 10/30/2018  . Tobacco abuse 12/28/2016    Past Surgical History:  Procedure Laterality Date  . ANTERIOR CRUCIATE LIGAMENT REPAIR      Prior to Admission medications   Medication Sig Start Date End Date Taking? Authorizing Provider  dicyclomine (BENTYL) 10 MG capsule Take 1 capsule (10 mg total) by mouth 3 (three) times daily as needed for up to 5 days for spasms. 04/27/19 05/02/19  Dionne BucySiadecki, Sebastian, MD  Multiple Vitamin (MULTIVITAMIN) tablet Take 1 tablet by mouth daily.    [provider]  naproxen (NAPROSYN) 500 MG tablet Take 1 tablet (500 mg total) by mouth 2 (two) times daily with a meal. 07/30/19   Joni ReiningSmith, Ronald K, PA-C  ondansetron (ZOFRAN ODT) 4 MG disintegrating tablet Take 1 tablet (4 mg total) by mouth every 8 (eight) hours as needed for nausea or vomiting. 04/22/19   Emily FilbertWilliams, Jonathan E, MD  oxyCODONE-acetaminophen (PERCOCET) 5-325 MG tablet  Take 1 tablet by mouth every 8 (eight) hours as needed. 04/22/19   Emily FilbertWilliams, Jonathan E, MD    Allergies Patient has no known allergies.  Family History  Problem Relation Age of Onset  . Cancer Mother        breast  . Asthma Sister   . Lung cancer Maternal Grandfather   . Colon cancer Other   . Throat cancer Other   . Cirrhosis Other        alcoholic    Social History Social History   Tobacco Use  . Smoking status: Current Every Day Smoker    Packs/day: 0.25    Types: Cigarettes  . Smokeless tobacco: Never Used  . Tobacco comment: 1-2 black and milds daily  Substance Use Topics  . Alcohol use: Yes    Comment: socially, denies heavy use except for recent birthday  . Drug use: Not Currently    Types: Marijuana    Review of Systems Constitutional: No fever/chills Eyes: No visual changes. ENT: No sore throat. Cardiovascular: Denies chest pain. Respiratory: Denies shortness of breath. Gastrointestinal: No abdominal pain.  No nausea, no vomiting.  No diarrhea.  No constipation. Genitourinary: Negative for dysuria. Musculoskeletal: Right shoulder pain.   Skin: Negative for rash. Neurological: Negative for headaches, focal weakness or numbness.   ____________________________________________   PHYSICAL EXAM:  VITAL SIGNS: ED Triage Vitals [07/30/19 0805]  Enc Vitals Group     BP  Pulse      Resp      Temp      Temp src      SpO2      Weight 208 lb (94.3 kg)     Height 6\' 2"  (1.88 m)     Head Circumference      Peak Flow      Pain Score 4     Pain Loc      Pain Edu?      Excl. in [place]?    Constitutional: Alert and oriented. Well appearing and in no acute distress. Neck: No cervical spine tenderness to palpation. Cardiovascular: Normal rate, regular rhythm. Grossly normal heart sounds.  Good peripheral circulation. Respiratory: Normal respiratory effort.  No retractions. Lungs CTAB. Musculoskeletal: No obvious deformity to the right shoulder.  Patient  has full and equal range of motion.  Patient is moderate [facility]ing [facility].   Neurologic:  Normal speech and language. No gross focal neurologic deficits are appreciated. No gait instability. Skin:  Skin is warm, dry and intact. No rash noted. Psychiatric: Mood and affect are normal. Speech and behavior are normal.  ____________________________________________   LABS (all labs ordered are listed, but only abnormal results are displayed)  Labs Reviewed - No data to display ____________________________________________  EKG   ____________________________________________  RADIOLOGY  ED MD interpretation:    Official radiology report(s): Dg Shoulder Right  Result Date: 07/30/2019 CLINICAL DATA:  Pain EXAM: RIGHT SHOULDER - 2+ VIEW COMPARISON:  None. FINDINGS: Oblique, Y scapular, and axillary images were obtained. There is no evident fracture or dislocation. Joint spaces appear normal. No erosive change or intra-articular calcification. Visualized right lung clear. IMPRESSION: No fracture or dislocation.  No evident arthropathy. Electronically Signed   By: Bretta BangWilliam  Woodruff III M.D.   On: 07/30/2019 09:15    ____________________________________________   PROCEDURES  Procedure(s) performed (including Critical Care):  Procedures   ____________________________________________   INITIAL IMPRESSION / ASSESSMENT AND PLAN / ED COURSE  As part of my medical decision making, I reviewed the following data within the electronic MEDICAL RECORD NUMBER         Tony Pitts was evaluated in Emergency Department on 07/30/2019 for the symptoms described in the history of present illness. He was evaluated in the context of the global COVID-19 pandemic, which necessitated consideration that the patient might be at risk for infection with the SARS-CoV-2 virus that causes COVID-19. Institutional protocols and algorithms that pertain to the evaluation of patients at risk for COVID-19 are  in a state of rapid change based on information released by regulatory bodies including the CDC and federal and state organizations. These policies and algorithms were followed during the patient's care in the ED.    Patient complain increasing right shoulder pain for 2 weeks.  Patient has a history of repetitive heavy lifting and physical training.  Physical exam was consistent with adhesive capsulitis.  Patient given discharge care instructions advised follow orthopedic no improvement in 7 to 10 days.  ____________________________________________   FINAL CLINICAL IMPRESSION(S) / ED DIAGNOSES  Final diagnoses:  Acute pain of right shoulder     ED Discharge Orders         Ordered    naproxen (NAPROSYN) 500 MG tablet  2 times daily with meals     07/30/19 [id number]           Note:  This document was prepared using Dragon voice recognition software and may include unintentional dictation errors.  Sable Feil, PA-C 07/30/19 [id number]    Earleen Newport, MD 07/30/19 [phone]

## 2019-08-26 ENCOUNTER — Emergency Department: Payer: 59

## 2019-08-26 ENCOUNTER — Emergency Department
Admission: EM | Admit: 2019-08-26 | Discharge: 2019-08-26 | Disposition: A | Discharge status: Home / Self Care | Payer: 59 | Attending: Student in an Organized Health Care Education/Training Program | Admitting: Student in an Organized Health Care Education/Training Program

## 2019-08-26 ENCOUNTER — Encounter: Payer: Self-pay | Admitting: Emergency Medicine

## 2019-08-26 ENCOUNTER — Other Ambulatory Visit: Payer: Self-pay

## 2019-08-26 DIAGNOSIS — F17210 Nicotine dependence, cigarettes, uncomplicated: Secondary | ICD-10-CM | POA: Insufficient documentation

## 2019-08-26 DIAGNOSIS — Z79899 Other long term (current) drug therapy: Secondary | ICD-10-CM | POA: Diagnosis not present

## 2019-08-26 DIAGNOSIS — Y929 Unspecified place or not applicable: Secondary | ICD-10-CM | POA: Diagnosis not present

## 2019-08-26 DIAGNOSIS — S8991XA Unspecified injury of right lower leg, initial encounter: Secondary | ICD-10-CM | POA: Diagnosis present

## 2019-08-26 DIAGNOSIS — X500XXA Overexertion from strenuous movement or load, initial encounter: Secondary | ICD-10-CM | POA: Insufficient documentation

## 2019-08-26 DIAGNOSIS — S8391XA Sprain of unspecified site of right knee, initial encounter: Secondary | ICD-10-CM | POA: Diagnosis not present

## 2019-08-26 DIAGNOSIS — Y9301 Activity, walking, marching and hiking: Secondary | ICD-10-CM | POA: Insufficient documentation

## 2019-08-26 DIAGNOSIS — Y999 Unspecified external cause status: Secondary | ICD-10-CM | POA: Insufficient documentation

## 2019-08-26 NOTE — Discharge Instructions (Addendum)
Follow-up with Dr. Mack Guise who is the orthopedist on call if any continued problems.  Continue ice and elevation as needed for pain and swelling.  Continue ibuprofen every 8 hours with food.  Wear the knee immobilizer anytime you are up walking.  You do not have to wear it at night when you are sleeping.

## 2019-08-26 NOTE — ED Triage Notes (Signed)
Patient presents to the ED with right knee pain since Friday.  Patient slipped on wet steps and states he heard his knee, "pop".  Patient is walking with limp.  Patient is in no obvious distress at this time.

## 2019-08-26 NOTE — ED Notes (Signed)
See triage note  Presents with right knee pain  States he developed pain when walking on steps couple of days ago  Denies any fall but states he felt a "pop"  No swelling noted able to bear wt

## 2019-08-26 NOTE — ED Provider Notes (Signed)
[facility] Emergency Department Provider Note  ____________________________________________   First MD Initiated Contact with Patient 08/26/19 [phone]     (approximate)  I have reviewed the triage vital signs and the nursing notes.   HISTORY  Chief Complaint Knee Pain   HPI Tony Pitts is a 23 y.o. male presents to the ED with complaint of right knee pain.  Patient states that several days ago when walking on some steps he felt a "pop".  He denies any injury and did not fall.  Patient states that approximately 10 years ago he had knee surgery.  He denies any other problems with his knee.  Currently rates pain as 6/10.      Past Medical History:  Diagnosis Date  . Kidney stone   . Tobacco abuse 12/28/2016    Patient Active Problem List   Diagnosis Date Noted  . 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 Abdominal pain, epigastric 10/30/2018  . Tobacco abuse 12/28/2016    Past Surgical History:  Procedure Laterality Date  . ANTERIOR CRUCIATE LIGAMENT REPAIR      Prior to Admission medications   Medication Sig Start Date End Date Taking? Authorizing Provider  Multiple Vitamin (MULTIVITAMIN) tablet Take 1 tablet by mouth daily.    [provider]    Allergies Patient has no known allergies.  Family History  Problem Relation Age of Onset  . Cancer Mother        breast  . Asthma Sister   . Lung cancer Maternal Grandfather   . Colon cancer Other   . Throat cancer Other   . Cirrhosis Other        alcoholic    Social History Social History   Tobacco Use  . Smoking status: Current Every Day Smoker    Packs/day: 0.25    Types: Cigarettes  . Smokeless tobacco: Never Used  . Tobacco comment: 1-2 black and milds daily  Substance Use Topics  . Alcohol use: Yes    Comment: socially, denies heavy use except for recent birthday  . Drug use: Not Currently    Types: Marijuana    Review of Systems Constitutional: No fever/chills  Cardiovascular: Denies chest pain. Respiratory: Denies shortness of breath. Genitourinary: Negative for dysuria. Musculoskeletal: Positive right knee pain. Skin: Negative for rash. Neurological: Negative for , focal weakness or numbness. ___________________________________________   PHYSICAL EXAM:  VITAL SIGNS: ED Triage Vitals  Enc Vitals Group     BP 08/26/19 0848 111/60     Pulse Rate 08/26/19 0848 (!) 57     Resp 08/26/19 0848 19     Temp 08/26/19 0848 99 F (37.2 C)     Temp Source 08/26/19 0848 Oral     SpO2 08/26/19 0848 99 %     Weight 08/26/19 0848 205 lb (93 kg)     Height 08/26/19 0848 6\' 2"  (1.88 m)     Head Circumference --      Peak Flow --      Pain Score 08/26/19 0855 6     Pain Loc --      Pain Edu? --      Excl. in [place]? --    Constitutional: Alert and oriented. Well appearing and in no acute distress. Eyes: Conjunctivae are normal.  Head: Atraumatic. Neck: No stridor.   Cardiovascular: Normal rate, regular rhythm. Grossly normal heart sounds.  Good peripheral circulation. Respiratory: Normal respiratory effort.  No retractions. Lungs CTAB. Musculoskeletal: On examination of the right  knee there is no gross deformity and no effusion is present.  There is generalized tenderness anteriorly.  Skin is intact.  Ligaments are stable bilaterally.  Patient is able to bear weight and gait was normal. Neurologic:  Normal speech and language. No gross focal neurologic deficits are appreciated. No gait instability. Skin:  Skin is warm, dry and intact. No rash noted. Psychiatric: Mood and affect are normal. Speech and behavior are normal.  ____________________________________________   LABS (all labs ordered are listed, but only abnormal results are displayed)  Labs Reviewed - No data to display  RADIOLOGY  Official radiology report(s): Dg Knee Complete 4 Views Right  Result Date: 08/26/2019 CLINICAL DATA:  Right knee pain after injury. EXAM: RIGHT KNEE -  COMPLETE 4+ VIEW COMPARISON:  Radiographs of October 13, 2009. FINDINGS: No evidence of fracture, dislocation, or joint effusion. No evidence of arthropathy or other focal bone abnormality. Soft tissues are unremarkable. IMPRESSION: Negative. Electronically Signed   By: Lupita RaiderJames  Green Jr M.D.   On: 08/26/2019 09:48    ____________________________________________   PROCEDURES  Procedure(s) performed (including Critical Care):  Procedures   ____________________________________________   INITIAL IMPRESSION / ASSESSMENT AND PLAN / ED COURSE  As part of my medical decision making, I reviewed the following data within the electronic MEDICAL RECORD NUMBER Notes from prior ED visits and [place] Controlled Substance Database  23 year old male presents to the ED with complaint of right knee pain.  Patient states that he was walking on some steps a couple of days ago and felt a "pop" to his right knee.  He states that 10 years ago he had surgery on his knee for a questionable torn ACL or MCL.  X-rays were unremarkable as well as physical exam.  Patient was placed in a knee immobilizer and encouraged to ice and elevate when he is at home.  He will continue taking ibuprofen and follow-up with Dr. Martha ClanKrasinski if any continued problems in 1 to 2 weeks.  ____________________________________________   FINAL CLINICAL IMPRESSION(S) / ED DIAGNOSES  Final diagnoses:  Sprain of right knee, unspecified ligament, initial encounter     ED Discharge Orders    None       Note:  This document was prepared using Dragon voice recognition software and may include unintentional dictation errors.    Tommi RumpsSummers, Rhonda L, PA-C 08/26/19 1308    Willy Eddyobinson, Patrick, MD 08/26/19 1336

## 2020-02-01 ENCOUNTER — Encounter: Payer: Self-pay | Admitting: Emergency Medicine

## 2020-02-01 ENCOUNTER — Emergency Department
Admission: EM | Admit: 2020-02-01 | Discharge: 2020-02-01 | Disposition: A | Discharge status: Home / Self Care | Payer: BC Managed Care – PPO | Attending: Emergency Medicine | Admitting: Emergency Medicine

## 2020-02-01 ENCOUNTER — Other Ambulatory Visit: Payer: Self-pay

## 2020-02-01 DIAGNOSIS — F17210 Nicotine dependence, cigarettes, uncomplicated: Secondary | ICD-10-CM | POA: Diagnosis not present

## 2020-02-01 DIAGNOSIS — R519 Headache, unspecified: Secondary | ICD-10-CM

## 2020-02-01 MED ORDER — KETOROLAC TROMETHAMINE 30 MG/ML IJ SOLN
30.0000 mg | Freq: Once | INTRAMUSCULAR | Status: AC
  Administered 2020-02-01: 30 mg via INTRAMUSCULAR

## 2020-02-01 MED ORDER — IBUPROFEN 600 MG PO TABS: 600.0000 mg | ORAL_TABLET | Freq: Four times a day (QID) | ORAL | 0 refills | Status: DC | PRN

## 2020-02-01 MED ORDER — METOCLOPRAMIDE HCL 10 MG PO TABS: 10.0000 mg | ORAL_TABLET | Freq: Three times a day (TID) | ORAL | 0 refills | Status: DC | PRN

## 2020-02-01 MED ORDER — METOCLOPRAMIDE HCL 10 MG PO TABS
10.0000 mg | ORAL_TABLET | Freq: Once | ORAL | Status: AC
  Administered 2020-02-01: 16:00:00 10 mg via ORAL
  Filled 2020-02-01: qty 1

## 2020-02-01 MED ORDER — KETOROLAC TROMETHAMINE 30 MG/ML IJ SOLN
30.0000 mg | Freq: Once | INTRAMUSCULAR | Status: DC
  Filled 2020-02-01: qty 1

## 2020-02-01 NOTE — ED Provider Notes (Signed)
[facility] Emergency Department Provider Note ____________________________________________   First MD Initiated Contact with Patient 02/01/20 1546     (approximate)  I have reviewed the triage vital signs and the nursing notes.   HISTORY  Chief Complaint Headache    HPI Tony Pitts is a 24 y.o. male with MH as noted below who presents with a headache, gradual onset over the last 3 days, bilateral and described as in a bandlike region around his head.  He describes it as throbbing, and is associated with photophobia but no nausea or vomiting.  He denies any prior history of this headache.  He has had no head trauma.  He denies any fever or neck stiffness.  Past Medical History:  Diagnosis Date  . Kidney stone   . Tobacco abuse 12/28/2016    Patient Active Problem List   Diagnosis Date Noted  . 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 Abdominal pain, epigastric 10/30/2018  . Tobacco abuse 12/28/2016    Past Surgical History:  Procedure Laterality Date  . ANTERIOR CRUCIATE LIGAMENT REPAIR      Prior to Admission medications   Medication Sig Start Date End Date Taking? Authorizing Provider  ibuprofen (ADVIL) 600 MG tablet Take 1 tablet (600 mg total) by mouth every 6 (six) hours as needed. 02/01/20   Dionne Bucy, MD  metoCLOPramide (REGLAN) 10 MG tablet Take 1 tablet (10 mg total) by mouth every 8 (eight) hours as needed for up to 5 days (headache). 02/01/20 02/06/20  Dionne Bucy, MD  Multiple Vitamin (MULTIVITAMIN) tablet Take 1 tablet by mouth daily.    [provider]    Allergies Patient has no known allergies.  Family History  Problem Relation Age of Onset  . Cancer Mother        breast  . Asthma Sister   . Lung cancer Maternal Grandfather   . Colon cancer Other   . Throat cancer Other   . Cirrhosis Other        alcoholic    Social History Social History   Tobacco Use  . Smoking status: Current Every Day  Smoker    Packs/day: 0.25    Types: Cigarettes  . Smokeless tobacco: Never Used  . Tobacco comment: 1-2 black and milds daily  Substance Use Topics  . Alcohol use: Yes    Comment: socially, denies heavy use except for recent birthday  . Drug use: Not Currently    Types: Marijuana    Review of Systems  Constitutional: No fever. Eyes: No visual changes. ENT: No neck pain. Cardiovascular: Denies chest pain. Respiratory: Denies shortness of breath. Gastrointestinal: No nausea or vomiting. Genitourinary: Negative for flank pain.  Musculoskeletal: Negative for back pain. Skin: Negative for rash. Neurological: Positive for headache.   ____________________________________________   PHYSICAL EXAM:  VITAL SIGNS: ED Triage Vitals  Enc Vitals Group     BP 02/01/20 1509 127/72     Pulse Rate 02/01/20 1509 60     Resp 02/01/20 1509 18     Temp 02/01/20 1509 98.3 F (36.8 C)     Temp Source 02/01/20 1509 Oral     SpO2 02/01/20 1509 100 %     Weight 02/01/20 1507 205 lb 0.4 oz (93 kg)     Height --      Head Circumference --      Peak Flow --      Pain Score 02/01/20 1507 5     Pain Loc --  Pain Edu? --      Excl. in [place]? --     Constitutional: Alert and oriented. Well appearing and in no acute distress. Eyes: Conjunctivae are normal.  EOMI.  PERRLA. Head: Atraumatic. Nose: No congestion/rhinnorhea. Mouth/Throat: Mucous membranes are moist.   Neck: Normal range of motion.  No meningeal signs. Cardiovascular: Normal rate, regular rhythm.  Good peripheral circulation. Respiratory: Normal respiratory effort.  No retractions.  Gastrointestinal:  No distention.  Musculoskeletal:  Extremities warm and well perfused.  Neurologic:  Normal speech and language.  No facial droop.  Motor and sensory intact in all extremities.  Normal coordination.  No ataxia on finger-to-nose.  No pronator drift. Skin:  Skin is warm and dry. No rash noted. Psychiatric: Mood and affect are  normal. Speech and behavior are normal.  ____________________________________________   LABS (all labs ordered are listed, but only abnormal results are displayed)  Labs Reviewed - No data to display ____________________________________________  EKG   ____________________________________________  RADIOLOGY    ____________________________________________   PROCEDURES  Procedure(s) performed: No  Procedures  Critical Care performed: No ____________________________________________   INITIAL IMPRESSION / ASSESSMENT AND PLAN / ED COURSE  Pertinent labs & imaging results that were available during my care of the patient were reviewed by me and considered in my medical decision making (see chart for details).  24 year old male with PMH as noted above presents with a gradual onset headache that has been present for the last several days and which is bilateral and bandlike, but also throbbing and associated with some mild photophobia.  He has no prior history of significant headaches.  On exam the patient is very well-appearing.  His vital signs are normal.  Neurologic exam is normal.  He has no meningeal signs.  Overall presentation is consistent with tension type headache versus migraine.  At this time, given the patient's well appearance, normal neurologic exam, and age, there is no indication for imaging or further work-up.  We will treat symptomatically with Reglan and Toradol.  The patient is stable for discharge home.  Return precautions given, and he expresses understanding.  ____________________________________________   FINAL CLINICAL IMPRESSION(S) / ED DIAGNOSES  Final diagnoses:  Acute nonintractable headache, unspecified headache type      NEW MEDICATIONS STARTED DURING THIS VISIT:  Discharge Medication List as of 02/01/2020  4:54 PM    START taking these medications   Details  ibuprofen (ADVIL) 600 MG tablet Take 1 tablet (600 mg total) by mouth every 6  (six) hours as needed., Starting Mon 02/01/2020, Normal    metoCLOPramide (REGLAN) 10 MG tablet Take 1 tablet (10 mg total) by mouth every 8 (eight) hours as needed for up to 5 days (headache)., Starting Mon 02/01/2020, Until Sat 02/06/2020, Normal         Note:  This document was prepared using Dragon voice recognition software and may include unintentional dictation errors.    Arta Silence, MD 02/01/20 1725

## 2020-02-01 NOTE — Discharge Instructions (Signed)
Take the ibuprofen and/or Reglan as needed for the next several days.  Return to the ER for new, worsening, or persistent severe headache, vision changes, fever, neck stiffness, or any other new or worsening symptoms that concern you.

## 2020-02-01 NOTE — ED Triage Notes (Signed)
C/O headache to forehead x 4 days.

## 2020-02-25 DIAGNOSIS — N189 Chronic kidney disease, unspecified: Secondary | ICD-10-CM

## 2020-02-25 HISTORY — DX: Chronic kidney disease, unspecified: N18.9

## 2020-03-04 ENCOUNTER — Other Ambulatory Visit: Payer: Self-pay

## 2020-03-04 ENCOUNTER — Emergency Department
Admission: EM | Admit: 2020-03-04 | Discharge: 2020-03-04 | Disposition: A | Discharge status: Home / Self Care | Payer: BC Managed Care – PPO | Attending: Student in an Organized Health Care Education/Training Program | Admitting: Student in an Organized Health Care Education/Training Program

## 2020-03-04 ENCOUNTER — Encounter: Payer: Self-pay | Admitting: Emergency Medicine

## 2020-03-04 DIAGNOSIS — K649 Unspecified hemorrhoids: Secondary | ICD-10-CM | POA: Diagnosis not present

## 2020-03-04 DIAGNOSIS — F17210 Nicotine dependence, cigarettes, uncomplicated: Secondary | ICD-10-CM | POA: Diagnosis not present

## 2020-03-04 DIAGNOSIS — K6289 Other specified diseases of anus and rectum: Secondary | ICD-10-CM | POA: Diagnosis present

## 2020-03-04 MED ORDER — POLYETHYLENE GLYCOL 3350 17 G PO PACK: 17.0000 g | PACK | Freq: Every day | ORAL | 0 refills | Status: DC

## 2020-03-04 MED ORDER — HYDROCORTISONE (PERIANAL) 2.5 % EX CREA: 1.0000 "application " | TOPICAL_CREAM | Freq: Two times a day (BID) | CUTANEOUS | 0 refills | Status: DC

## 2020-03-04 NOTE — ED Provider Notes (Signed)
[facility] Emergency Department Provider Note    First MD Initiated Contact with Patient 03/04/20 [phone]     (approximate)  I have reviewed the triage vital signs and the nursing notes.   HISTORY  Chief Complaint Hemorrhoids    HPI Tony Pitts is a 24 y.o. male no significant past medical history presents to the ER for evaluation of itching and discomfort in his rectum for the past 3 weeks.  States he initially started scratching 2 weeks ago and started becoming more irritated feels some swelling.  States he started using Tucks wipes and then Preparation H.  Does not feel it is rapidly progressing but is persistently there is he want to get checked out.  Denies any history of hemorrhoids.  No history of constipation.  He is sexually active with women.  No history of receptive anal intercourse.  Not had any fevers.  No other complaints.    Past Medical History:  Diagnosis Date  . Kidney stone   . Tobacco abuse 12/28/2016   Family History  Problem Relation Age of Onset  . Cancer Mother        breast  . Asthma Sister   . Lung cancer Maternal Grandfather   . Colon cancer Other   . Throat cancer Other   . Cirrhosis Other        alcoholic   Past Surgical History:  Procedure Laterality Date  . ANTERIOR CRUCIATE LIGAMENT REPAIR     Patient Active Problem List   Diagnosis Date Noted  . 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 Abdominal pain, epigastric 10/30/2018  . Tobacco abuse 12/28/2016      Prior to Admission medications   Medication Sig Start Date End Date Taking? Authorizing Provider  hydrocortisone (ANUSOL-HC) 2.5 % rectal cream Place 1 application rectally 2 (two) times daily. 03/04/20   Willy Eddy, MD  ibuprofen (ADVIL) 600 MG tablet Take 1 tablet (600 mg total) by mouth every 6 (six) hours as needed. 02/01/20   Dionne Bucy, MD  metoCLOPramide (REGLAN) 10 MG tablet Take 1 tablet (10 mg total) by mouth every 8 (eight) hours  as needed for up to 5 days (headache). 02/01/20 02/06/20  Dionne Bucy, MD  Multiple Vitamin (MULTIVITAMIN) tablet Take 1 tablet by mouth daily.    [provider]  polyethylene glycol (MIRALAX / GLYCOLAX) 17 g packet Take 17 g by mouth daily. Mix one tablespoon with 8oz of your favorite juice or water every day until you are having soft formed stools. Then start taking once daily if you didn't have a stool the day before. 03/04/20   Willy Eddy, MD    Allergies Patient has no known allergies.    Social History Social History   Tobacco Use  . Smoking status: Current Every Day Smoker    Packs/day: 0.25    Types: Cigarettes  . Smokeless tobacco: Never Used  . Tobacco comment: 1-2 black and milds daily  Substance Use Topics  . Alcohol use: Yes    Comment: socially, denies heavy use except for recent birthday  . Drug use: Not Currently    Types: Marijuana    Review of Systems Patient denies headaches, rhinorrhea, blurry vision, numbness, shortness of breath, chest pain, edema, cough, abdominal pain, nausea, vomiting, diarrhea, dysuria, fevers, rashes or hallucinations unless otherwise stated above in HPI. ____________________________________________   PHYSICAL EXAM:  VITAL SIGNS: Vitals:   03/04/20 0646 03/04/20 0700  BP: (!) 147/80 138/80  Pulse: 78 81  Resp: 16   Temp:    SpO2: 99% 100%    Constitutional: Alert and oriented.  Eyes: Conjunctivae are normal.  Head: Atraumatic. Nose: No congestion/rhinnorhea. Mouth/Throat: Mucous membranes are moist.   Neck: No stridor. Painless ROM.  Cardiovascular: Normal rate, regular rhythm. Grossly normal heart sounds.  Good peripheral circulation. Respiratory: Normal respiratory effort.  No retractions. Lungs CTAB. Gastrointestinal: Soft and nontender. No distention. No abdominal bruits. No CVA tenderness. Genitourinary: 1 small nonthrombosed hemorrhoid.  No fluctuance or abscess.  No masses.  No anal fissures.  No  overlying or surrounding cellulitis. Musculoskeletal: No lower extremity tenderness nor edema.  No joint effusions. Neurologic:  Normal speech and language. No gross focal neurologic deficits are appreciated. No facial droop Skin:  Skin is warm, dry and intact. No rash noted. Psychiatric: Mood and affect are normal. Speech and behavior are normal.  ____________________________________________   LABS (all labs ordered are listed, but only abnormal results are displayed)  No results found for this or any previous visit (from the past 24 hour(s)). ____________________________________________ ____________________________________________  RADIOLOGY   ____________________________________________   PROCEDURES  Procedure(s) performed:  Procedures    Critical Care performed: no ____________________________________________   INITIAL IMPRESSION / ASSESSMENT AND PLAN / ED COURSE  Pertinent labs & imaging results that were available during my care of the patient were reviewed by me and considered in my medical decision making (see chart for details).   DDX: Hemorrhoid, abscess, anal fissure, proctitis, cellulitis  Tony Pitts is a 24 y.o. who presents to the ED with symptoms as described above.  Presentation consistent with small hemorrhoid causing irritation.  Exam otherwise reassuring.  Will provide topical cream laxatives discussed conservative management as well as signs and symptoms for which he should return to the ER.  Have discussed with the patient and available family all diagnostics and treatments performed thus far and all questions were answered to the best of my ability. The patient demonstrates understanding and agreement with plan.      The patient was evaluated in Emergency Department today for the symptoms described in the history of present illness. He/she was evaluated in the context of the global COVID-19 pandemic, which necessitated consideration that the  patient might be at risk for infection with the SARS-CoV-2 virus that causes COVID-19. Institutional protocols and algorithms that pertain to the evaluation of patients at risk for COVID-19 are in a state of rapid change based on information released by regulatory bodies including the CDC and federal and state organizations. These policies and algorithms were followed during the patient's care in the ED.  As part of my medical decision making, I reviewed the following data within the electronic MEDICAL RECORD NUMBER Nursing notes reviewed and incorporated, Labs reviewed, notes from prior ED visits and [place] Controlled Substance Database   ____________________________________________   FINAL CLINICAL IMPRESSION(S) / ED DIAGNOSES  Final diagnoses:  Hemorrhoids, unspecified hemorrhoid type      NEW MEDICATIONS STARTED DURING THIS VISIT:  Discharge Medication List as of 03/04/2020  7:13 AM    START taking these medications   Details  hydrocortisone (ANUSOL-HC) 2.5 % rectal cream Place 1 application rectally 2 (two) times daily., Starting Fri 03/04/2020, Normal    polyethylene glycol (MIRALAX / GLYCOLAX) 17 g packet Take 17 g by mouth daily. Mix one tablespoon with 8oz of your favorite juice or water every day until you are having soft formed stools. Then start taking once daily if you didn't have a stool the day  before., Starting Fri 03/04/2020, Normal         Note:  This document was prepared using Dragon voice recognition software and may include unintentional dictation errors.    Merlyn Lot, MD 03/04/20 0900

## 2020-03-04 NOTE — ED Notes (Signed)
Pt states he has what he believes are hemorrhoids that started "about a week or two ago"; reports pain and itching.  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  He reports seeing some bright red blood after a bowel movement.  Currently rates his pain 3/10 and describes it as discomfort.

## 2020-03-04 NOTE — ED Triage Notes (Signed)
Patient ambulatory to triage with steady gait, without difficulty or distress noted, mask in place; pt st "I'm having some discomfort in the buttocks area"; st ?hemorrhoid

## 2020-09-03 ENCOUNTER — Encounter: Payer: Self-pay | Admitting: Emergency Medicine

## 2020-09-03 ENCOUNTER — Emergency Department
Admission: EM | Admit: 2020-09-03 | Discharge: 2020-09-03 | Disposition: A | Discharge status: Home / Self Care | Payer: BC Managed Care – PPO | Attending: Emergency Medicine | Admitting: Emergency Medicine

## 2020-09-03 ENCOUNTER — Other Ambulatory Visit: Payer: Self-pay

## 2020-09-03 DIAGNOSIS — R1032 Left lower quadrant pain: Secondary | ICD-10-CM | POA: Diagnosis not present

## 2020-09-03 DIAGNOSIS — R1030 Lower abdominal pain, unspecified: Secondary | ICD-10-CM | POA: Diagnosis present

## 2020-09-03 DIAGNOSIS — Z5321 Procedure and treatment not carried out due to patient leaving prior to being seen by health care provider: Secondary | ICD-10-CM | POA: Diagnosis not present

## 2020-09-03 DIAGNOSIS — R1031 Right lower quadrant pain: Secondary | ICD-10-CM | POA: Diagnosis not present

## 2020-09-03 LAB — COMPREHENSIVE METABOLIC PANEL
ALT: 25 U/L (ref 0–44)
AST: 41 U/L (ref 15–41)
Albumin: 4.5 g/dL (ref 3.5–5.0)
Alkaline Phosphatase: 52 U/L (ref 38–126)
Anion gap: 7 (ref 5–15)
BUN: 18 mg/dL (ref 6–20)
CO2: 27 mmol/L (ref 22–32)
Calcium: 9.8 mg/dL (ref 8.9–10.3)
Chloride: 109 mmol/L (ref 98–111)
Creatinine, Ser: 1.37 mg/dL — ABNORMAL HIGH (ref 0.61–1.24)
GFR calc Af Amer: >60 mL/min (ref >60)
GFR calc non Af Amer: >60 mL/min (ref >60)
Glucose, Bld: 94 mg/dL (ref 70–99)
Potassium: 3.6 mmol/L (ref 3.5–5.1)
Sodium: 143 mmol/L (ref 135–145)
Total Bilirubin: 1 mg/dL (ref 0.3–1.2)
Total Protein: 7.6 g/dL (ref 6.5–8.1)

## 2020-09-03 LAB — CBC
HCT: 40.6 % (ref 39.0–52.0)
Hemoglobin: 15 g/dL (ref 13.0–17.0)
MCH: 32.7 pg (ref 26.0–34.0)
MCHC: 36.9 g/dL — ABNORMAL HIGH (ref 30.0–36.0)
MCV: 88.5 fL (ref 80.0–100.0)
Platelets: 249 10*3/uL (ref 150–400)
RBC: 4.59 MIL/uL (ref 4.22–5.81)
RDW: 12.9 % (ref 11.5–15.5)
WBC: 7.4 10*3/uL (ref 4.0–10.5)
nRBC: 0 % (ref 0.0–0.2)

## 2020-09-03 LAB — LIPASE, BLOOD: Lipase: 26 U/L (ref 11–51)

## 2020-09-03 MED ORDER — ACETAMINOPHEN 325 MG PO TABS
650.0000 mg | ORAL_TABLET | Freq: Once | ORAL | Status: AC
  Administered 2020-09-03: 650 mg via ORAL
  Filled 2020-09-03: qty 2

## 2020-09-03 NOTE — ED Triage Notes (Signed)
Pt reports he developed lower abdominal pain RLQ, LLQ about an hr ago. Denies any injury to area, reports last BM midday Friday. Pt denies any nausea, vomiting or diarrhea. Pt tearful in triage.

## 2020-10-11 ENCOUNTER — Emergency Department: Payer: BC Managed Care – PPO

## 2020-10-11 ENCOUNTER — Other Ambulatory Visit: Payer: Self-pay

## 2020-10-11 ENCOUNTER — Emergency Department
Admission: EM | Admit: 2020-10-11 | Discharge: 2020-10-11 | Disposition: A | Discharge status: Home / Self Care | Payer: BC Managed Care – PPO | Attending: Emergency Medicine | Admitting: Emergency Medicine

## 2020-10-11 DIAGNOSIS — N200 Calculus of kidney: Secondary | ICD-10-CM | POA: Insufficient documentation

## 2020-10-11 DIAGNOSIS — R319 Hematuria, unspecified: Secondary | ICD-10-CM | POA: Diagnosis present

## 2020-10-11 DIAGNOSIS — R109 Unspecified abdominal pain: Secondary | ICD-10-CM | POA: Diagnosis not present

## 2020-10-11 DIAGNOSIS — F17210 Nicotine dependence, cigarettes, uncomplicated: Secondary | ICD-10-CM | POA: Insufficient documentation

## 2020-10-11 LAB — URINALYSIS, COMPLETE (UACMP) WITH MICROSCOPIC
Bacteria, UA: NONE SEEN
Bilirubin Urine: NEGATIVE
Glucose, UA: NEGATIVE mg/dL
Hgb urine dipstick: NEGATIVE
Ketones, ur: NEGATIVE mg/dL
Leukocytes,Ua: NEGATIVE
Nitrite: NEGATIVE
Protein, ur: NEGATIVE mg/dL
Specific Gravity, Urine: 1.026 (ref 1.005–1.030)
pH: 5 (ref 5.0–8.0)

## 2020-10-11 LAB — CHLAMYDIA/NGC RT PCR (ARMC ONLY)
Chlamydia Tr: NOT DETECTED
N gonorrhoeae: NOT DETECTED

## 2020-10-11 MED ORDER — PHENAZOPYRIDINE HCL 100 MG PO TABS: 100.0000 mg | ORAL_TABLET | Freq: Three times a day (TID) | ORAL | 0 refills | Status: DC | PRN

## 2020-10-11 NOTE — ED Notes (Signed)
Signature pad not working, pt verbalizes understanding of d/c instructions. Denies questions/concerns

## 2020-10-11 NOTE — Discharge Instructions (Signed)
Follow-up with your primary care provider or Dr. Lonna Cobb who is on-call for urology if there is continued hematuria or if you develop left flank pain.  There is no evidence of a kidney stone on your scan today.  This may be a one-time incident.  Increase fluids.

## 2020-10-11 NOTE — ED Provider Notes (Signed)
[facility] Emergency Department Provider Note  ____________________________________________   None    (approximate)  I have reviewed the triage vital signs and the nursing notes.   HISTORY  Chief Complaint Back Pain and Hematuria   HPI Tony Pitts is a 24 y.o. male presents to the ED with complaint of blood in his urine 3 days ago and some left lower back pain.  Patient states he has some pain with urination and trouble urinating.  He denies any nausea, vomiting, fever or chills during this event.  He is not seeing any continued hematuria.  He denies any previous kidney stones and is unaware of any family members having history.  Currently rates pain as 5 out of 10 and denies any nausea or vomiting.      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 Authorizing Provider  hydrocortisone (ANUSOL-HC) 2.5 % rectal cream Place 1 application rectally 2 (two) times daily. 03/04/20   Willy Eddy, MD  ibuprofen (ADVIL) 600 MG tablet Take 1 tablet (600 mg total) by mouth every 6 (six) hours as needed. 02/01/20   Dionne Bucy, MD  metoCLOPramide (REGLAN) 10 MG tablet Take 1 tablet (10 mg total) by mouth every 8 (eight) hours as needed for up to 5 days (headache). 02/01/20 02/06/20  Dionne Bucy, MD  Multiple Vitamin (MULTIVITAMIN) tablet Take 1 tablet by mouth daily.    [provider]  phenazopyridine (PYRIDIUM) 100 MG tablet Take 1 tablet (100 mg total) by mouth 3 (three) times daily as needed for pain. 10/11/20 10/11/21  Tommi Rumps, PA-C  polyethylene  glycol (MIRALAX / GLYCOLAX) 17 g packet Take 17 g by mouth daily. Mix one tablespoon with 8oz of your favorite juice or water every day until you are having soft formed stools. Then start taking once daily if you didn't have a stool the day before. 03/04/20   Willy Eddy, MD    Allergies Patient has no known allergies.  Family History  Problem Relation Age of Onset   Cancer Mother        breast   Asthma Sister    Lung cancer Maternal Grandfather    Colon cancer Other    Throat cancer Other    Cirrhosis Other        alcoholic    Social History Social History   Tobacco Use   Smoking status: Current Every Day Smoker    Packs/day: 0.25    Types: Cigarettes   Smokeless tobacco: Never Used   Tobacco comment: 1-2 black and milds daily  Vaping Use   Vaping Use: Never used  Substance Use Topics   Alcohol use: Yes    Comment: socially, denies heavy use except for recent birthday   Drug use: Not Currently    Types: Marijuana    Review of Systems Constitutional: No fever/chills Eyes: No visual changes. ENT: No sore throat. Cardiovascular: Denies chest pain. Respiratory: Denies shortness of breath. Gastrointestinal: No abdominal pain.  No nausea, no vomiting.  No diarrhea.   Genitourinary: Positive one episode of hematuria resolved.  Denies penile discharge. Musculoskeletal: Positive left flank pain mild. Skin: Negative for rash. Neurological: Negative for headaches, focal weakness or numbness. ____________________________________________   PHYSICAL EXAM:  VITAL SIGNS: ED Triage Vitals  Enc Vitals Group     BP 10/11/20 1438 140/73     Pulse Rate 10/11/20 1438 66     Resp 10/11/20 1438 18     Temp 10/11/20 1438 99.1 F (37.3 C)     Temp src --      SpO2 10/11/20 1438 99 %     Weight 10/11/20 1435 200 lb (90.7 kg)     Height 10/11/20 1435 6\' 2"  (1.88 m)     Head Circumference --      Peak Flow --      Pain Score 10/11/20 1435 5     Pain Loc --       Pain Edu? --      Excl. in GC? --     Constitutional: Alert and oriented. Well appearing and in no acute distress. Eyes: Conjunctivae are normal.  Head: Atraumatic. Neck: No stridor.   Cardiovascular: Normal rate, regular rhythm. Grossly normal heart sounds.  Good peripheral circulation. Respiratory: Normal respiratory effort.  No retractions. Lungs CTAB. Gastrointestinal: Soft and nontender. No distention.  No CVA tenderness. Musculoskeletal: Moves upper and lower extremities without any difficulty.  Normal gait was noted. Neurologic:  Normal speech and language. No gross focal neurologic deficits are appreciated. No gait instability. Skin:  Skin is warm, dry and intact. No rash noted. Psychiatric: Mood and affect are normal. Speech and behavior are normal.  ____________________________________________   LABS (all labs ordered are listed, but only abnormal results are displayed)  Labs Reviewed  URINALYSIS, COMPLETE (UACMP) WITH MICROSCOPIC - Abnormal; Notable for the following components:      Result Value   Color, Urine YELLOW (*)    APPearance CLEAR (*)    All other components within normal limits  CHLAMYDIA/NGC RT PCR ([facility] ONLY)    RADIOLOGY I, 12/11/20, personally viewed and evaluated these images (plain radiographs) as part of my medical decision making, as well as reviewing the written report by the radiologist.   Official radiology report(s): CT Renal Stone Study  Result Date: 10/11/2020 CLINICAL DATA:  Flank pain, gross hematuria. EXAM: CT ABDOMEN AND PELVIS WITHOUT CONTRAST TECHNIQUE: Multidetector CT imaging of the abdomen and pelvis was performed following the standard protocol without IV contrast. COMPARISON:  October 19, 2018.  November 05, 2018. FINDINGS: Lower chest: No acute abnormality. Hepatobiliary: No focal liver abnormality is seen. No gallstones, gallbladder wall thickening, or biliary dilatation. Pancreas: Unremarkable. No pancreatic  ductal dilatation or surrounding inflammatory changes. Spleen: Normal in size without focal abnormality. Adrenals/Urinary Tract: Adrenal glands are unremarkable. Kidneys are normal, without renal calculi, focal lesion, or hydronephrosis. Mild wall thickening of the urinary bladder is noted which may be due to incomplete distention, but cystitis cannot be excluded. Stomach/Bowel: The stomach appears normal. There is no evidence of bowel obstruction or inflammation. Vascular/Lymphatic: No significant vascular findings are present. No enlarged abdominal or pelvic lymph nodes. Reproductive: Prostate is unremarkable. Other: No abdominal wall hernia or abnormality. No abdominopelvic ascites. Musculoskeletal: No acute or significant osseous findings. IMPRESSION: 1. Mild wall thickening of the urinary bladder is noted which may be due to incomplete distention, but cystitis cannot be excluded. 2. No other abnormality seen in the abdomen or pelvis. Electronically Signed   By: November 07, 2018 M.D.   On: 10/11/2020 16:33  ____________________________________________   PROCEDURES  Procedure(s) performed (including Critical Care):  Procedures   ____________________________________________   INITIAL IMPRESSION / ASSESSMENT AND PLAN / ED COURSE  As part of my medical decision making, I reviewed the following data within the electronic MEDICAL RECORD NUMBER Notes from prior ED visits and [place] Controlled Substance Database  24 year old male presents to the ED with one episode of hematuria along with left flank pain that has resolved completely.  Patient denies any injury and is unaware of any previous kidney stones.  He is not aware of anyone in his family having kidney stones.  He denies any fever, chills, nausea or vomiting.  He also denies any penile discharge and states that he has little concern for an STI.  In talking with him he still wants to be tested for STIs.  He was made aware that this test would be done  but his CT does not show stones nor does his urinalysis at this time show any bacteria or WBCs.  Patient is encouraged to increase fluids and also follow-up with urologist if any continued problems.  ____________________________________________   FINAL CLINICAL IMPRESSION(S) / ED DIAGNOSES  Final diagnoses:  Acute left flank pain     ED Discharge Orders         Ordered    phenazopyridine (PYRIDIUM) 100 MG tablet  3 times daily PRN        10/11/20 1703          *Please note:  TYRESE CAPRIOTTI was evaluated in Emergency Department on 10/11/2020 for the symptoms described in the history of present illness. He was evaluated in the context of the global COVID-19 pandemic, which necessitated consideration that the patient might be at risk for infection with the SARS-CoV-2 virus that causes COVID-19. Institutional protocols and algorithms that pertain to the evaluation of patients at risk for COVID-19 are in a state of rapid change based on information released by regulatory bodies including the CDC and federal and state organizations. These policies and algorithms were followed during the patient's care in the ED.  Some ED evaluations and interventions may be delayed as a result of limited staffing during and the pandemic.*   Note:  This document was prepared using Dragon voice recognition software and may include unintentional dictation errors.    Tommi Rumps, PA-C 10/11/20 1749    Dionne Bucy, MD 10/13/20 1515

## 2020-10-11 NOTE — ED Triage Notes (Signed)
Pt come via POV from home with c/o blood in urine and some lower back pain. Pt states pain when urinating and trouble urinating.  Pt states dark colored urine. Pt unsure if dehydrated.

## 2021-01-08 ENCOUNTER — Emergency Department
Admission: EM | Admit: 2021-01-08 | Discharge: 2021-01-08 | Disposition: A | Discharge status: Home / Self Care | Payer: BC Managed Care – PPO | Attending: Emergency Medicine | Admitting: Emergency Medicine

## 2021-01-08 ENCOUNTER — Other Ambulatory Visit: Payer: Self-pay

## 2021-01-08 ENCOUNTER — Encounter: Payer: Self-pay | Admitting: Emergency Medicine

## 2021-01-08 DIAGNOSIS — X58XXXA Exposure to other specified factors, initial encounter: Secondary | ICD-10-CM | POA: Insufficient documentation

## 2021-01-08 DIAGNOSIS — S01511A Laceration without foreign body of lip, initial encounter: Secondary | ICD-10-CM | POA: Insufficient documentation

## 2021-01-08 DIAGNOSIS — Z5321 Procedure and treatment not carried out due to patient leaving prior to being seen by health care provider: Secondary | ICD-10-CM | POA: Insufficient documentation

## 2021-01-08 NOTE — ED Notes (Signed)
Pt to front desk asking how much longer it will be for him to be seen. Pt informed we cannot give wait times but will be a while before he is seen. Pt states that he has to pick his mom up from the airport and will come back later to be seen.

## 2021-01-08 NOTE — ED Triage Notes (Signed)
Pt to ED via POV for laceration to the lip. Bleeding is controlled at this time. Pt is in NAD.

## 2021-09-19 ENCOUNTER — Other Ambulatory Visit: Payer: Self-pay

## 2021-09-19 ENCOUNTER — Emergency Department
Admission: EM | Admit: 2021-09-19 | Discharge: 2021-09-19 | Disposition: A | Discharge status: Home / Self Care | Payer: No Typology Code available for payment source | Attending: Emergency Medicine | Admitting: Emergency Medicine

## 2021-09-19 ENCOUNTER — Emergency Department: Payer: No Typology Code available for payment source

## 2021-09-19 DIAGNOSIS — S3992XA Unspecified injury of lower back, initial encounter: Secondary | ICD-10-CM | POA: Diagnosis present

## 2021-09-19 DIAGNOSIS — F17210 Nicotine dependence, cigarettes, uncomplicated: Secondary | ICD-10-CM | POA: Insufficient documentation

## 2021-09-19 DIAGNOSIS — S39012A Strain of muscle, fascia and tendon of lower back, initial encounter: Secondary | ICD-10-CM | POA: Diagnosis not present

## 2021-09-19 DIAGNOSIS — Y92410 Unspecified street and highway as the place of occurrence of the external cause: Secondary | ICD-10-CM | POA: Insufficient documentation

## 2021-09-19 DIAGNOSIS — S161XXA Strain of muscle, fascia and tendon at neck level, initial encounter: Secondary | ICD-10-CM | POA: Insufficient documentation

## 2021-09-19 LAB — URINALYSIS, COMPLETE (UACMP) WITH MICROSCOPIC
Bacteria, UA: NONE SEEN
Bilirubin Urine: NEGATIVE
Glucose, UA: NEGATIVE mg/dL
Hgb urine dipstick: NEGATIVE
Ketones, ur: NEGATIVE mg/dL
Leukocytes,Ua: NEGATIVE
Nitrite: NEGATIVE
Protein, ur: NEGATIVE mg/dL
Specific Gravity, Urine: 1.02 (ref 1.005–1.030)
Squamous Epithelial / HPF: NONE SEEN (ref 0–5)
pH: 7 (ref 5.0–8.0)

## 2021-09-19 MED ORDER — MELOXICAM 15 MG PO TABS: 15.0000 mg | ORAL_TABLET | Freq: Every day | ORAL | 2 refills | Status: DC

## 2021-09-19 MED ORDER — BACLOFEN 10 MG PO TABS: 10.0000 mg | ORAL_TABLET | Freq: Three times a day (TID) | ORAL | 0 refills | Status: AC
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
## 2021-09-19 NOTE — ED Notes (Signed)
See triage note  presents with lower back pain  states pain is mid lower back  denies any injury  ambulates well  has had some urinary urgency
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
## 2021-09-19 NOTE — Discharge Instructions (Addendum)
Follow-up with your regular doctor if not improving to 3 days.  Or follow-up with orthopedics if not better in 1 week. Take the medication as prescribed.  Apply ice to all areas that hurt. Return if worsening It is normal for you to be sore for approximately 7 days.

## 2021-09-19 NOTE — ED Notes (Signed)
Pt also states some more urinary urgency and frequency here recent. Pt states hx of kidney issues.

## 2021-09-20 ENCOUNTER — Encounter: Payer: Self-pay | Admitting: Emergency Medicine

## 2021-09-20 ENCOUNTER — Emergency Department
Admission: EM | Admit: 2021-09-20 | Discharge: 2021-09-20 | Disposition: A | Discharge status: Home / Self Care | Payer: Self-pay | Attending: Student in an Organized Health Care Education/Training Program | Admitting: Student in an Organized Health Care Education/Training Program

## 2021-09-20 ENCOUNTER — Other Ambulatory Visit: Payer: Self-pay

## 2021-09-20 DIAGNOSIS — F17210 Nicotine dependence, cigarettes, uncomplicated: Secondary | ICD-10-CM | POA: Insufficient documentation

## 2021-09-20 DIAGNOSIS — Y990 Civilian activity done for income or pay: Secondary | ICD-10-CM | POA: Insufficient documentation

## 2021-09-20 DIAGNOSIS — X500XXA Overexertion from strenuous movement or load, initial encounter: Secondary | ICD-10-CM | POA: Insufficient documentation

## 2021-09-20 DIAGNOSIS — M545 Low back pain, unspecified: Secondary | ICD-10-CM | POA: Insufficient documentation

## 2021-09-20 NOTE — Discharge Instructions (Signed)
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
## 2021-09-20 NOTE — ED Triage Notes (Addendum)
States was seen yesterday but continues to be in pain.  Wishes to have another day off.  Needs a work note.  Has not filled prescription yet.

## 2021-09-20 NOTE — ED Notes (Signed)
E-signature not working at this time. Pt verbalized understanding of D/C instructions, prescriptions and follow up care with no further questions at this time. Pt in NAD and ambulatory at time of D/C.  

## 2021-09-28 ENCOUNTER — Emergency Department
Admission: EM | Admit: 2021-09-28 | Discharge: 2021-09-28 | Disposition: A | Discharge status: Home / Self Care | Payer: Self-pay | Attending: Emergency Medicine | Admitting: Emergency Medicine

## 2021-09-28 ENCOUNTER — Encounter: Payer: Self-pay | Admitting: Emergency Medicine

## 2021-09-28 ENCOUNTER — Other Ambulatory Visit: Payer: Self-pay

## 2021-09-28 DIAGNOSIS — F17210 Nicotine dependence, cigarettes, uncomplicated: Secondary | ICD-10-CM | POA: Insufficient documentation

## 2021-09-28 DIAGNOSIS — Z0279 Encounter for issue of other medical certificate: Secondary | ICD-10-CM | POA: Insufficient documentation

## 2021-09-28 DIAGNOSIS — Y92410 Unspecified street and highway as the place of occurrence of the external cause: Secondary | ICD-10-CM | POA: Insufficient documentation
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
## 2021-11-30 ENCOUNTER — Emergency Department
Admission: EM | Admit: 2021-11-30 | Discharge: 2021-11-30 | Disposition: A | Discharge status: Home / Self Care | Payer: Self-pay | Attending: Emergency Medicine | Admitting: Emergency Medicine

## 2021-11-30 ENCOUNTER — Other Ambulatory Visit: Payer: Self-pay

## 2021-11-30 DIAGNOSIS — B349 Viral infection, unspecified: Secondary | ICD-10-CM | POA: Insufficient documentation

## 2021-11-30 DIAGNOSIS — Z20822 Contact with and (suspected) exposure to covid-19: Secondary | ICD-10-CM | POA: Insufficient documentation

## 2021-11-30 DIAGNOSIS — F17210 Nicotine dependence, cigarettes, uncomplicated: Secondary | ICD-10-CM | POA: Insufficient documentation

## 2021-11-30 DIAGNOSIS — J40 Bronchitis, not specified as acute or chronic: Secondary | ICD-10-CM | POA: Insufficient documentation

## 2021-11-30 LAB — RESP PANEL BY RT-PCR (FLU A&B, COVID) ARPGX2
Influenza A by PCR: NEGATIVE
Influenza B by PCR: NEGATIVE
SARS Coronavirus 2 by RT PCR: NEGATIVE

## 2021-11-30 MED ORDER — ONDANSETRON 4 MG PO TBDP: 4.0000 mg | ORAL_TABLET | Freq: Three times a day (TID) | ORAL | 0 refills | Status: AC | PRN

## 2021-11-30 MED ORDER — ALBUTEROL SULFATE HFA 108 (90 BASE) MCG/ACT IN AERS: 2.0000 | INHALATION_SPRAY | Freq: Four times a day (QID) | RESPIRATORY_TRACT | 2 refills | Status: DC | PRN

## 2021-11-30 NOTE — ED Provider Notes (Signed)
[facility] Emergency Department Provider Note    ____________________________________________   Event Date/Time   First MD Initiated Contact with Patient 11/30/21 1921     (approximate)  I have reviewed the triage vital signs and the nursing notes.   HISTORY  Chief Complaint Influenza    HPI Tony Pitts is a 25 y.o. male, history of nephrolithiasis, presents to the emergency department for evaluation of fever, cough, chest tightness, and congestion X 3 days.  Additionally he reports 2-3 episodes of vomiting and diarrhea.  Denies chest pain, SOB, abdominal pain, or urinary symptoms.  He has not been evaluated by any other provider or attempted any treatments at home.  History limited by: No limitations.  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 Authorizing Provider  albuterol (VENTOLIN HFA) 108 (90 Base) MCG/ACT inhaler Inhale 2 puffs into the lungs every 6 (six) hours as needed for wheezing or shortness of breath. 11/30/21  Yes Teodoro Spray, PA  ondansetron (ZOFRAN-ODT) 4 MG disintegrating tablet Take 1 tablet (4 mg total) by mouth every 8 (eight) hours as needed for up to 5 days for nausea or vomiting. 11/30/21 12/05/21 Yes Teodoro Spray, PA  hydrocortisone (ANUSOL-HC) 2.5 % rectal cream Place 1 application rectally 2 (two) times daily. 03/04/20   Merlyn Lot, MD  meloxicam (MOBIC) 15 MG tablet Take 1 tablet (15 mg total) by mouth daily. 09/19/21 09/19/22  Fisher, Linden Dolin, PA-C  Multiple Vitamin (MULTIVITAMIN) tablet Take 1 tablet by mouth daily.    [provider]  polyethylene glycol (MIRALAX / GLYCOLAX) 17 g packet Take 17 g by mouth daily. Mix one tablespoon with 8oz of your favorite juice or water every day until you are having soft formed stools. Then start taking once daily if you didn't have a stool the day before. 03/04/20   Merlyn Lot, MD    Allergies Patient has no known allergies.  Family History  Problem Relation Age of Onset   Cancer Mother        breast   Asthma Sister    Lung cancer Maternal Grandfather    Colon cancer Other    Throat cancer Other    Cirrhosis Other        alcoholic    Social History Social History   Tobacco Use   Smoking status: Every Day    Packs/day: 0.25    Years: 10.00    Pack years: 2.50    Types: Cigarettes   Smokeless tobacco: Never   Tobacco comments:    1-2 black and milds daily  Vaping Use   Vaping Use: Never used  Substance Use Topics   Alcohol use: Yes    Alcohol/week: 1.0 standard drink    Types: 1 Shots of liquor per week    Comment: socially, denies heavy use except for recent birthday   Drug use: Not Currently    Types: Marijuana    Review of Systems  Constitutional: Positive for fever, negative for weight loss, or fatigue.  Eyes: Negative for visual changes or discharge.  ENT:  Positive for sinus congestion negative for hearing changes, or sore throat.  Gastrointestinal: Positive for vomiting and diarrhea negative for abdominal pain Genitourinary: Negative for dysuria or hematuria.  Musculoskeletal: Negative for back pain or joint pain.  Skin: Negative for rashes or lesions.  Neurological: Negative for headache, syncope, dizziness, tremors, or numbness/tingling.   10-point ROS otherwise negative. ____________________________________________   PHYSICAL EXAM:  VITAL SIGNS: ED Triage Vitals  Enc Vitals Group     BP 11/30/21 1703 139/77     Pulse Rate 11/30/21 1703 (!) 58     Resp 11/30/21 1703 19     Temp 11/30/21 1703 98.7 F (37.1 C)     Temp  Source 11/30/21 1703 Oral     SpO2 11/30/21 1703 100 %     Weight 11/30/21 1704 207 lb (93.9 kg)     Height 11/30/21 1704 6\' 2"  (1.88 m)     Head Circumference --      Peak Flow --      Pain Score 11/30/21 1704 0     Pain Loc --      Pain Edu? --      Excl. in [place]? --     Physical Exam Constitutional:      Appearance: Normal appearance.  HENT:     Head: Normocephalic and atraumatic.     Nose: Nose normal.     Mouth/Throat:     Mouth: Mucous membranes are moist.     Pharynx: Oropharynx is clear. No oropharyngeal exudate or posterior oropharyngeal erythema.  Eyes:     Extraocular Movements: Extraocular movements intact.     Conjunctiva/sclera: Conjunctivae normal.     Pupils: Pupils are equal, round, and reactive to light.  Cardiovascular:     Rate and Rhythm: Normal rate and regular rhythm.     Pulses: Normal pulses.     Heart sounds: Normal heart sounds.  Pulmonary:     Effort: Pulmonary effort is normal.     Breath sounds: Normal breath sounds. No wheezing or rhonchi.  Abdominal:     General: Abdomen is flat.     Palpations: Abdomen is soft.     Tenderness: There is no abdominal tenderness. There is no guarding.  Musculoskeletal:        General: Normal range of motion.     Cervical back: Normal range of motion and neck supple.  Skin:    General: Skin is warm and dry.  Neurological:     General: No focal deficit present.     Mental Status: He is alert and oriented to person, place, and time.  Psychiatric:        Mood and Affect: Mood normal.        Behavior: Behavior normal.        Thought Content: Thought content normal.        Judgment: Judgment normal.     ____________________________________________    LABS  (all labs ordered are listed, but only abnormal results are displayed)  Labs Reviewed  RESP PANEL BY RT-PCR (FLU A&B, COVID) [id number]      ____________________________________________   EKG None.   ____________________________________________    RADIOLOGY I personally viewed and evaluated these images as part of my medical decision making, as well as reviewing the written report by the radiologist.  ED Provider Interpretation: Not applicable.  No results found.  ____________________________________________   PROCEDURES  Procedures   Medications - No data to display  Critical Care performed: No  ____________________________________________   INITIAL  IMPRESSION / ASSESSMENT AND PLAN / ED COURSE  Pertinent labs & imaging results that were available during my care of the patient were reviewed by me and considered in my medical decision making (see chart for details).       Tony Pitts is a 25 y.o. male, history of nephrolithiasis, presents to the emergency department for evaluation of fever, cough, congestion X 3 days.  Additionally he reports 2-3 episodes of vomiting and diarrhea.  Denies chest pain, SOB, abdominal pain, or urinary symptoms.  He has not been evaluated by any other provider or attempted any treatments at home.  Upon presentation, patient appears well.  He is sitting upright, comfortably in bed.  NAD.  Physical exam overall is unremarkable.  Lung sounds are clear bilaterally.  Respiratory panel is negative for COVID or influenza.  Based on the patient's history and physical exam, I suspect that the patient has bronchitis and that his symptoms are likely due to a viral pathogen.  Due to the patient's endorsement of chest tightness and persistent nausea, I will prescribe him an albuterol inhaler as well as a short course of ondansetron.  We will plan to discharge the patient with strict return precautions, anticipatory guidance, and follow-up as needed     ____________________________________________   FINAL CLINICAL IMPRESSION(S) / ED DIAGNOSES  Final diagnoses:  Viral illness   Bronchitis     NEW MEDICATIONS STARTED DURING THIS VISIT:  ED Discharge Orders          Ordered    albuterol (VENTOLIN HFA) 108 (90 Base) MCG/ACT inhaler  Every 6 hours PRN        11/30/21 1946    ondansetron (ZOFRAN-ODT) 4 MG disintegrating tablet  Every 8 hours PRN        11/30/21 1946             Note:  This document was prepared using Dragon voice recognition software and may include unintentional dictation errors.    Varney Daily, [place] 12/01/21 [id number]    Phineas Semen, MD 12/02/21 [phone]

## 2021-11-30 NOTE — ED Triage Notes (Signed)
Since Monday pt has had nausea, vomiting, fever, diarrhea, cough, and congestion.

## 2021-11-30 NOTE — ED Provider Notes (Signed)
  Emergency Medicine Provider Triage Evaluation Note  Tony Pitts , a 25 y.o.male,  was evaluated in triage.  Pt complains of fever, cough, congestion X 3 days.  Additionally reports some vomiting and diarrhea.  Denies chest pain, SOB, abdominal pain, or urinary symptoms.   Review of Systems  Positive: Fever, cough, congestion, vomiting, diarrhea Negative: Denies abdominal pain, urinary symptoms, chest pain  Physical Exam   Vitals:   11/30/21 1703  BP: 139/77  Pulse: (!) 58  Resp: 19  Temp: 98.7 F (37.1 C)  SpO2: 100%   Gen:   Awake, no distress   Resp:  Normal effort  MSK:   Moves extremities without difficulty  Other:    Medical Decision Making  Given the patient's initial medical screening exam, the following diagnostic evaluation has been ordered. The patient will be placed in the appropriate treatment space, once one is available, to complete the evaluation and treatment. I have discussed the plan of care with the patient and I have advised the patient that an ED physician or mid-level practitioner will reevaluate their condition after the test results have been received, as the results may give them additional insight into the type of treatment they may need.    Diagnostics: Respiratory panel.  Treatments: none immediately   Varney Daily, [place] 11/30/21 1719    Phineas Semen, MD 11/30/21 1757

## 2021-12-16 ENCOUNTER — Emergency Department
Admission: EM | Admit: 2021-12-16 | Discharge: 2021-12-16 | Disposition: A | Discharge status: Home / Self Care | Payer: Self-pay | Attending: Emergency Medicine | Admitting: Emergency Medicine

## 2021-12-16 ENCOUNTER — Other Ambulatory Visit: Payer: Self-pay

## 2021-12-16 ENCOUNTER — Emergency Department: Payer: Self-pay

## 2021-12-16 DIAGNOSIS — R109 Unspecified abdominal pain: Secondary | ICD-10-CM | POA: Insufficient documentation

## 2021-12-16 DIAGNOSIS — M545 Low back pain, unspecified: Secondary | ICD-10-CM | POA: Insufficient documentation

## 2021-12-16 DIAGNOSIS — N50819 Testicular pain, unspecified: Secondary | ICD-10-CM

## 2021-12-16 DIAGNOSIS — R300 Dysuria: Secondary | ICD-10-CM | POA: Insufficient documentation

## 2021-12-16 DIAGNOSIS — N50811 Right testicular pain: Secondary | ICD-10-CM | POA: Insufficient documentation

## 2021-12-16 DIAGNOSIS — N50812 Left testicular pain: Secondary | ICD-10-CM | POA: Insufficient documentation

## 2021-12-16 DIAGNOSIS — F17210 Nicotine dependence, cigarettes, uncomplicated: Secondary | ICD-10-CM | POA: Insufficient documentation

## 2021-12-16 LAB — CBC
HCT: 44.4 % (ref 39.0–52.0)
Hemoglobin: 15.2 g/dL (ref 13.0–17.0)
MCH: 31.7 pg (ref 26.0–34.0)
MCHC: 34.2 g/dL (ref 30.0–36.0)
MCV: 92.7 fL (ref 80.0–100.0)
Platelets: 236 10*3/uL (ref 150–400)
RBC: 4.79 MIL/uL (ref 4.22–5.81)
RDW: 12.9 % (ref 11.5–15.5)
WBC: 4.9 10*3/uL (ref 4.0–10.5)
nRBC: 0 % (ref 0.0–0.2)

## 2021-12-16 LAB — BASIC METABOLIC PANEL
Anion gap: 3 — ABNORMAL LOW (ref 5–15)
BUN: 8 mg/dL (ref 6–20)
CO2: 29 mmol/L (ref 22–32)
Calcium: 8.9 mg/dL (ref 8.9–10.3)
Chloride: 110 mmol/L (ref 98–111)
Creatinine, Ser: 1.25 mg/dL — ABNORMAL HIGH (ref 0.61–1.24)
GFR, Estimated: >60 mL/min (ref >60)
Glucose, Bld: 93 mg/dL (ref 70–99)
Potassium: 4.6 mmol/L (ref 3.5–5.1)
Sodium: 142 mmol/L (ref 135–145)

## 2021-12-16 LAB — URINALYSIS, ROUTINE W REFLEX MICROSCOPIC
Bilirubin Urine: NEGATIVE
Glucose, UA: NEGATIVE mg/dL
Hgb urine dipstick: NEGATIVE
Ketones, ur: NEGATIVE mg/dL
Leukocytes,Ua: NEGATIVE
Nitrite: NEGATIVE
Protein, ur: NEGATIVE mg/dL
Specific Gravity, Urine: 1.012 (ref 1.005–1.030)
pH: 6 (ref 5.0–8.0)

## 2021-12-16 LAB — CHLAMYDIA/NGC RT PCR (ARMC ONLY)
Chlamydia Tr: NOT DETECTED
N gonorrhoeae: NOT DETECTED

## 2021-12-16 MED ORDER — KETOROLAC TROMETHAMINE 30 MG/ML IJ SOLN
30.0000 mg | Freq: Once | INTRAMUSCULAR | Status: AC
  Administered 2021-12-16: 30 mg via INTRAMUSCULAR
  Filled 2021-12-16: qty 1

## 2021-12-16 MED ORDER — NAPROXEN 500 MG PO TABS: 500.0000 mg | ORAL_TABLET | Freq: Two times a day (BID) | ORAL | 0 refills | Status: DC

## 2021-12-16 NOTE — Discharge Instructions (Signed)
The results of your test for gonorrhea and chlamydia can be seen on MyChart later today.  Currently you are being treated for muscle skeletal pain for your back.  Increase fluids and stay hydrated.  If your test results are positive for either chlamydia or gonorrhea you can make an appointment with the health department or over the weekend you will need to return to the emergency department for treatment.  You may use ice or heat to your lower back as needed for discomfort.  A prescription for naproxen was sent to your pharmacy.

## 2021-12-16 NOTE — ED Provider Notes (Signed)
[facility] Emergency Department Provider Note   ____________________________________________   Event Date/Time   First MD Initiated Contact with Patient 12/16/21 0720     (approximate)  I have reviewed the triage vital signs and the nursing notes.   HISTORY  Chief Complaint Back Pain   HPI Tony Pitts is a 25 y.o. male presents to the ED with complaint of left lower back pain for 3 days without history of injury.  Patient also complains of burning with urination and is worried about an STD.  Patient reports he has not had unprotected sex and denies any penile discharge.  He also denies any injury to his back or previous back problems.  He also complains of bilateral testicular pain with onset of his back pain.  He reports he has had kidney stones in the past but denies any hematuria.  Currently rates pain as 3 out of 10.         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 Authorizing Provider  naproxen (NAPROSYN) 500 MG tablet Take 1 tablet (500 mg total) by mouth 2 (two) times daily with a meal. 12/16/21  Yes Letitia Neri L, PA-C  albuterol (VENTOLIN HFA) 108 (90 Base) MCG/ACT inhaler Inhale 2 puffs into the lungs every 6 (six) hours as needed for wheezing or shortness of breath. 11/30/21   Teodoro Spray, PA  hydrocortisone (ANUSOL-HC) 2.5 % rectal cream Place 1 application rectally 2 (two) times daily. 03/04/20   Merlyn Lot, MD  meloxicam (MOBIC) 15 MG tablet Take 1 tablet (15 mg total) by mouth daily. 09/19/21 09/19/22  Fisher, Linden Dolin, PA-C  Multiple Vitamin  (MULTIVITAMIN) tablet Take 1 tablet by mouth daily.    [provider]  polyethylene glycol (MIRALAX / GLYCOLAX) 17 g packet Take 17 g by mouth daily. Mix one tablespoon with 8oz of your favorite juice or water every day until you are having soft formed stools. Then start taking once daily if you didn't have a stool the day before. 03/04/20   Merlyn Lot, MD    Allergies Patient has no known allergies.  Family History  Problem Relation Age of Onset   Cancer Mother        breast   Asthma Sister    Lung cancer Maternal Grandfather    Colon cancer Other    Throat cancer Other    Cirrhosis Other        alcoholic    Social History Social History   Tobacco Use   Smoking status: Every Day    Packs/day: 0.25    Years: 10.00    Pack years: 2.50    Types: Cigarettes   Smokeless tobacco: Never   Tobacco comments:    1-2 black and milds daily  Vaping Use   Vaping Use: Never used  Substance Use Topics   Alcohol use: Yes    Alcohol/week: 1.0 standard drink    Types: 1 Shots of liquor per week    Comment: socially, denies heavy use except for recent birthday   Drug use: Not  Currently    Types: Marijuana    Review of Systems Constitutional: No fever/chills Eyes: No visual changes. ENT: No sore throat. Cardiovascular: Denies chest pain. Respiratory: Denies shortness of breath. Gastrointestinal: No abdominal pain.  No nausea, no vomiting.  No diarrhea.   Genitourinary: Positive for dysuria.  Positive for bilateral testicular pain. Musculoskeletal: Positive for left low back pain. Skin: Negative for rash. Neurological: Negative for headaches, focal weakness or numbness.  ____________________________________________   PHYSICAL EXAM:  VITAL SIGNS: ED Triage Vitals  Enc Vitals Group     BP 12/16/21 0401 (!) 142/84     Pulse Rate 12/16/21 0401 64     Resp 12/16/21 0401 16     Temp 12/16/21 0401 98.3 F (36.8 C)     Temp Source 12/16/21 0401 Oral     SpO2  12/16/21 0401 97 %     Weight 12/16/21 0402 207 lb (93.9 kg)     Height 12/16/21 0402 6\' 2"  (1.88 m)     Head Circumference --      Peak Flow --      Pain Score 12/16/21 0401 3     Pain Loc --      Pain Edu? --      Excl. in [place]? --     Constitutional: Alert and oriented. Well appearing and in no acute distress. Eyes: Conjunctivae are normal.  Head: Atraumatic. Neck: No stridor.   Cardiovascular: Normal rate, regular rhythm. Grossly normal heart sounds.  Good peripheral circulation. Respiratory: Normal respiratory effort.  No retractions. Lungs CTAB. Gastrointestinal: Soft and nontender. No distention.  No CVA tenderness.  Bowel sounds normoactive x4 quadrants. Musculoskeletal: No point tenderness on palpation of the thoracic or lumbar spine.  There is moderate tenderness on palpation of the left paravertebral muscles.  Good muscle strength bilaterally.  Normal gait was noted.  Patient is ambulatory without any assistance. Neurologic:  Normal speech and language. No gross focal neurologic deficits are appreciated. No gait instability. Skin:  Skin is warm, dry and intact. No rash noted. Psychiatric: Mood and affect are normal. Speech and behavior are normal.  ____________________________________________   LABS (all labs ordered are listed, but only abnormal results are displayed)  Labs Reviewed  BASIC METABOLIC PANEL - Abnormal; Notable for the following components:      Result Value   Creatinine, Ser 1.25 (*)    Anion gap 3 (*)    All other components within normal limits  URINALYSIS, ROUTINE W REFLEX MICROSCOPIC - Abnormal; Notable for the following components:   Color, Urine STRAW (*)    APPearance CLEAR (*)    All other components within normal limits  CHLAMYDIA/NGC RT PCR ([facility] ONLY)            CBC   ____________________________________________  RADIOLOGY I, Johnn Hai, personally viewed and evaluated these images (plain radiographs) as part of my medical decision  making, as well as reviewing the written report by the radiologist.   Official radiology report(s): CT Renal Stone Study  Result Date: 12/16/2021 CLINICAL DATA:  25 year old male with history of flank and low back pain for the past 3 days with some associated radiation into the testicle and burning during urination. EXAM: CT ABDOMEN AND PELVIS WITHOUT CONTRAST TECHNIQUE: Multidetector CT imaging of the abdomen and pelvis was performed following the standard protocol without IV contrast. COMPARISON:  CT the abdomen and pelvis 10/11/2020. FINDINGS: Lower chest: Unremarkable. Hepatobiliary: No definite suspicious cystic or solid hepatic lesions are confidently identified on  today's noncontrast CT examination. Unenhanced appearance of the gallbladder is normal. Pancreas: No definite pancreatic mass or peripancreatic fluid collections or inflammatory changes are noted on today's noncontrast CT examination. Spleen: Unremarkable. Adrenals/Urinary Tract: There are no abnormal calcifications within the collecting system of either kidney, along the course of either ureter, or within the lumen of the urinary bladder. No hydroureteronephrosis or perinephric stranding to suggest urinary tract obstruction at this time. The unenhanced appearance of the kidneys is unremarkable bilaterally. Unenhanced appearance of the urinary bladder is normal. Bilateral adrenal glands are normal in appearance. Stomach/Bowel: Unenhanced appearance of the stomach is normal. No pathologic dilatation of small bowel or colon. Normal appendix. Vascular/Lymphatic: No atherosclerotic calcifications are noted in the abdominal aorta or pelvic vasculature. No lymphadenopathy noted in the abdomen or pelvis. Reproductive: Prostate gland and seminal vesicles are unremarkable in appearance. Other: No significant volume of ascites.  No pneumoperitoneum. Musculoskeletal: There are no aggressive appearing lytic or blastic lesions noted in the visualized  portions of the skeleton. IMPRESSION: 1. No acute findings are noted in the abdomen or pelvis to account for the patient's symptoms. Specifically, no urinary tract calculi no findings of urinary tract obstruction are noted at this time. Electronically Signed   By: Vinnie Langton M.D.   On: 12/16/2021 06:12   US SCROTUM W/DOPPLER  Result Date: 12/16/2021 CLINICAL DATA:  Bilateral testicle pain for 3 days EXAM: SCROTAL ULTRASOUND DOPPLER ULTRASOUND OF THE TESTICLES TECHNIQUE: Complete ultrasound examination of the testicles, epididymis, and other scrotal structures was performed. Color and spectral Doppler ultrasound were also utilized to evaluate blood flow to the testicles. COMPARISON:  None. FINDINGS: Right testicle Measurements: 4.5 x 1.7 x 2.4 cm. No mass or microlithiasis visualized. Left testicle Measurements: 3.8 x 1.6 x 1.9 cm. No mass or microlithiasis visualized. Right epididymis:  Normal in size and appearance. Left epididymis:  Normal in size and appearance. Hydrocele:  None visualized. Varicocele:  None visualized. Pulsed Doppler interrogation of both testes demonstrates normal low resistance arterial and venous waveforms bilaterally. IMPRESSION: Normal testicular ultrasound. Electronically Signed   By: Maurine Simmering M.D.   On: 12/16/2021 09:00    ____________________________________________   PROCEDURES  Procedure(s) performed (including Critical Care):  Procedures   ____________________________________________   INITIAL IMPRESSION / ASSESSMENT AND PLAN / ED COURSE  As part of my medical decision making, I reviewed the following data within the electronic MEDICAL RECORD NUMBER Notes from prior ED visits and [place] Controlled Substance Database  25 year old male presents to the ED with complaint of left lower back pain along with recent dysuria and concerns for STD.  Patient denies any penile discharge.  He denies any unprotected sex.  He also denied any injury to his back.  No previous  back problems.  Urinalysis was negative and CT scan was performed and did not show any stones causing his pain.  Patient did have point tenderness on palpation of his left paravertebral muscles with decreased range of motion secondary to increase of his pain.  He was given Toradol 30 mg IM.  Scrotal ultrasound was negative and patient was made aware.  At the time of his discharge his gonorrhea and Chlamydia test were still pending.  He is aware that he can see this information on MyChart later this afternoon.  He will return to the emergency department most likely over the weekend for treatment should either of these be positive.  Patient was discharged with prescription for naproxen for muscle skeletal pain.  He is encouraged  to drink fluids and return if any severe worsening of his symptoms.  ____________________________________________   FINAL CLINICAL IMPRESSION(S) / ED DIAGNOSES  Final diagnoses:  Acute left-sided low back pain without sciatica  Dysuria     ED Discharge Orders          Ordered    naproxen (NAPROSYN) 500 MG tablet  2 times daily with meals        12/16/21 0933             Note:  This document was prepared using Dragon voice recognition software and may include unintentional dictation errors.    Tommi Rumps, PA-C 12/16/21 [id number]    Gilles Chiquito, MD 12/16/21 [phone]

## 2021-12-16 NOTE — ED Triage Notes (Signed)
Pt presents to ER c/o lower back x3 days with associated testicular pain and burning with urination.  Pt states pain is worse on left side.  Pt A&O x4 at this time in NAD.  Pt states he does have hx of kidney stones in past.

## 2022-03-17 ENCOUNTER — Other Ambulatory Visit: Payer: Self-pay

## 2022-03-17 ENCOUNTER — Ambulatory Visit
Admission: EM | Admit: 2022-03-17 | Discharge: 2022-03-17 | Disposition: A | Discharge status: Home / Self Care | Payer: Worker's Compensation

## 2022-03-17 DIAGNOSIS — S39012A Strain of muscle, fascia and tendon of lower back, initial encounter: Secondary | ICD-10-CM | POA: Diagnosis not present

## 2022-03-17 DIAGNOSIS — M545 Low back pain, unspecified: Secondary | ICD-10-CM | POA: Diagnosis not present

## 2022-03-17 MED ORDER — METHYLPREDNISOLONE 4 MG PO TBPK: ORAL_TABLET | ORAL | 0 refills | Status: DC

## 2022-03-17 MED ORDER — CYCLOBENZAPRINE HCL 10 MG PO TABS: 10.0000 mg | ORAL_TABLET | Freq: Three times a day (TID) | ORAL | 0 refills | Status: AC | PRN

## 2022-03-17 MED ORDER — NAPROXEN 500 MG PO TABS: 500.0000 mg | ORAL_TABLET | Freq: Two times a day (BID) | ORAL | 0 refills | Status: DC

## 2022-03-17 NOTE — ED Provider Notes (Signed)
?[facility] ? ? ? ?CSN: [id number] ?Arrival date & time: 03/17/22  6384 ? ? ?  ? ?History   ?Chief Complaint ?Chief Complaint  ?Patient presents with  ? Back Pain  ?  Work related injury.  ? ? ?HPI ?Tony Pitts is a 26 y.o. male presenting for lower back pain following an injury that he sustained at work on 03/14/2022.  Patient works at [employer] in [place], [place].  He says he was lifting a 135 pound box by himself off the ground.  He says he did not have any pain when he did that but afterwards he noticed throughout the day that he started to have aching pain in his lower back mostly on the left side.  States he twisted that way when he lifted the box.  Pain is 6 out of 10.  He has been taking Tylenol which she says helps.  Pain does not radiate the lower extremities and is not associate with any numbness, tingling or weakness.  Patient has increased pain when he leans forward.  Reports he works Monday through Friday and has to lift a lot of heavy boxes.  Patient does report that he has some back pain issues around 2013 due to football injuries but says he never had any sort of surgery and it was nothing major.  No other injuries or complaints. ? ?HPI ? ?Past Medical History:  ?Diagnosis Date  ? Chronic kidney disease 02/25/2020  ? Kidney stone   ? Tobacco abuse 12/28/2016  ? ? ?Patient Active Problem List  ? Diagnosis Date Noted  ? Chronic kidney disease 02/25/2020  ? Liver mass 10/30/2018  ? Epigastric pain 10/30/2018  ? Tobacco user 12/28/2016  ? ? ?Past Surgical History:  ?Procedure Laterality Date  ? ANTERIOR CRUCIATE LIGAMENT REPAIR    ? ? ? ? ? ?Home Medications   ? ?Prior to Admission medications   ?Medication Sig Start Date End Date Taking? Authorizing Provider  ?cyclobenzaprine (FLEXERIL) 10 MG tablet Take 1 tablet (10 mg total) by mouth 3 (three) times daily as needed for up to 7 days for muscle spasms. 03/17/22 03/24/22 Yes Eusebio Friendly B, PA-C  ?methylPREDNISolone (MEDROL DOSEPAK) 4 MG TBPK  tablet Take according to Dosepak instructions 03/17/22  Yes Shirlee Latch, PA-C  ?metoCLOPramide (REGLAN) 10 MG tablet Take by mouth. 02/01/20  Yes [provider]  ?albuterol (VENTOLIN HFA) 108 (90 Base) MCG/ACT inhaler Inhale into the lungs.    [provider]  ?hydrocortisone (ANUSOL-HC) 2.5 % rectal cream Place 1 application rectally 2 (two) times daily. 03/04/20   Willy Eddy, MD  ?Multiple Vitamin (MULTIVITAMIN) tablet Take by mouth.    [provider]  ?polyethylene glycol (MIRALAX / GLYCOLAX) 17 g packet Take 17 g by mouth daily. Mix one tablespoon with 8oz of your favorite juice or water every day until you are having soft formed stools. Then start taking once daily if you didn't have a stool the day before. 03/04/20   Willy Eddy, MD  ? ? ?Family History ?Family History  ?Problem Relation Age of Onset  ? Cancer Mother   ?     breast  ? Asthma Sister   ? Lung cancer Maternal Grandfather   ? Colon cancer Other   ? Throat cancer Other   ? Cirrhosis Other   ?     alcoholic  ? ? ?Social History ?Social History  ? ?Tobacco Use  ? Smoking status: Every Day  ?  Packs/day: 0.25  ?  Years: 10.00  ?  Pack years: 2.50  ?  Types: Cigarettes  ? Smokeless tobacco: Never  ? Tobacco comments:  ?  1-2 black and milds daily  ?Vaping Use  ? Vaping Use: Never used  ?Substance Use Topics  ? Alcohol use: Yes  ?  Alcohol/week: 1.0 standard drink  ?  Types: 1 Shots of liquor per week  ?  Comment: socially, denies heavy use except for recent birthday  ? Drug use: Not Currently  ?  Types: Marijuana  ? ? ? ?Allergies   ?Patient has no known allergies. ? ? ?Review of Systems ?Review of Systems  ?Musculoskeletal:  Positive for back pain. Negative for arthralgias, gait problem and myalgias.  ?Skin:  Negative for wound.  ?Neurological:  Negative for weakness and numbness.  ? ? ?Physical Exam ?Triage Vital Signs ?ED Triage Vitals  ?Enc Vitals Group  ?   BP 03/17/22 1017 104/66  ?   Pulse Rate 03/17/22  1017 68  ?   Resp 03/17/22 1017 18  ?   Temp 03/17/22 1017 98.2 ?F (36.8 ?C)  ?   Temp Source 03/17/22 1017 Oral  ?   SpO2 03/17/22 1017 100 %  ?   Weight 03/17/22 1016 197 lb (89.4 kg)  ?   Height 03/17/22 1016 6\' 2"  (1.88 m)  ?   Head Circumference --   ?   Peak Flow --   ?   Pain Score 03/17/22 1015 5  ?   Pain Loc --   ?   Pain Edu? --   ?   Excl. in GC? --   ? ?No data found. ? ?Updated Vital Signs ?BP 104/66 (BP Location: Left Arm)   Pulse 68   Temp 98.2 ?F (36.8 ?C) (Oral)   Resp 18   Ht 6\' 2"  (1.88 m)   Wt 197 lb (89.4 kg)   SpO2 100%   BMI 25.29 kg/m?  ?   ? ?Physical Exam ?Vitals and nursing note reviewed.  ?Constitutional:   ?   General: He is not in acute distress. ?   Appearance: Normal appearance. He is well-developed. He is not ill-appearing.  ?HENT:  ?   Head: Normocephalic and atraumatic.  ?Eyes:  ?   General: No scleral icterus. ?   Conjunctiva/sclera: Conjunctivae normal.  ?Cardiovascular:  ?   Rate and Rhythm: Normal rate and regular rhythm.  ?   Heart sounds: Normal heart sounds.  ?Pulmonary:  ?   Effort: Pulmonary effort is normal. No respiratory distress.  ?   Breath sounds: Normal breath sounds.  ?Musculoskeletal:  ?   Cervical back: Neck supple.  ?   Lumbar back: Tenderness (TTP diffusely of left paralumbar muscles w/ associated muscle stiffness) present. No bony tenderness. Decreased range of motion (in all directions moderately). Negative right straight leg raise test and negative left straight leg raise test.  ?     Back: ? ?   Comments: 5 out of 5 strength bilateral lower extremities  ?Skin: ?   General: Skin is warm and dry.  ?   Capillary Refill: Capillary refill takes less than 2 seconds.  ?Neurological:  ?   General: No focal deficit present.  ?   Mental Status: He is alert. Mental status is at baseline.  ?   Motor: No weakness.  ?   Coordination: Coordination normal.  ?Psychiatric:     ?   Mood and Affect: Mood normal.     ?   Behavior:  Behavior normal.     ?   Thought  Content: Thought content normal.  ? ? ? ?UC Treatments / Results  ?Labs ?(all labs ordered are listed, but only abnormal results are displayed) ?Labs Reviewed - No data to display ? ?EKG ? ? ?Radiology ?No results found. ? ?Procedures ?Procedures (including critical care time) ? ?Medications Ordered in UC ?Medications - No data to display ? ?Initial Impression / Assessment and Plan / UC Course  ?I have reviewed the triage vital signs and the nursing notes. ? ?Pertinent labs & imaging results that were available during my care of the patient were reviewed by me and considered in my medical decision making (see chart for details). ? ?26 year old male presents for work-related injury that he sustained 3 days ago.  Reports pain in the left lower back following lifting 135 pound box off the ground by himself.  No trauma.  No radicular symptoms.  No loss of bowel or bladder control or leg weakness.  On exam he does have tenderness to palpation throughout the left lumbar area with associated muscle stiffness.  Reduced range of motion in all directions, especially with forward flexion due to pain and discomfort.  About a 5 strength bilateral lower extremities.  Negative straight leg raise.  Patient's condition consistent with lumbar strain and muscle spasms.  Discussed this with him.  Advised no lifting greater than 10 pounds over the next week until he can follow-up with occupational health.  In the meantime, we will treat him with Medrol since he has a history of kidney disease.  Also sent Flexeril.  Encouraged use of heat, stretches, muscle rubs.  Reviewed ED precautions with him and given work note. ? ? ?Final Clinical Impressions(s) / UC Diagnoses  ? ?Final diagnoses:  ?Strain of lumbar region, initial encounter  ?Acute left-sided low back pain without sciatica  ? ? ? ?Discharge Instructions   ? ?  ?BACK PAIN: You strained your back. Stressed avoiding painful activities . RICE (REST, ICE, COMPRESSION, ELEVATION)  guidelines reviewed. May alternate ice and heat. Consider use of muscle rubs, Salonpas patches, etc. Use medications as directed including muscle relaxers if prescribed. Take anti-inflammatory medications as prescribed or OTC

## 2022-03-17 NOTE — Discharge Instructions (Addendum)
BACK PAIN: You strained your back. Stressed avoiding painful activities . RICE (REST, ICE, COMPRESSION, ELEVATION) guidelines reviewed. May alternate ice and heat. Consider use of muscle rubs, Salonpas patches, etc. Use medications as directed including muscle relaxers if prescribed. Take anti-inflammatory medications as prescribed or OTC NSAIDs/Tylenol.  I sent medrol, a corticosteroid due to your kidney issues. F/u with occupational health for next visit:  ? ?Follow up with Joanna Hews, NP ?[address] [place], [place] [address] ? ?Call to make appointment at [phone] ? ?Clinic hours: 8a-5p M-F  ? ?BACK PAIN RED FLAGS: If the back pain acutely worsens or there are any red flag symptoms such as numbness/tingling, leg weakness, saddle anesthesia, or loss of bowel/bladder control, go immediately to the ER. Follow up with [place] as scheduled or sooner if the pain does not begin to resolve or if it worsens before the follow up   ?

## 2022-03-17 NOTE — ED Triage Notes (Signed)
Patient is here for Back Pain. Work related injury. DOI: [id number], PM. Employer: [employer] in [place], reported to employer. "Lifting a heavy box, had to get it hight enough to put on other boxes, and felt jerk, tightness in lower back". Pain since. No previous ED/UC visit since injury.  ?

## 2022-05-07 ENCOUNTER — Encounter (HOSPITAL_COMMUNITY): Payer: Self-pay | Admitting: Emergency Medicine

## 2022-05-07 ENCOUNTER — Emergency Department (HOSPITAL_COMMUNITY)
Admission: EM | Admit: 2022-05-07 | Discharge: 2022-05-08 | Disposition: A | Discharge status: Home / Self Care | Payer: Self-pay | Attending: Emergency Medicine | Admitting: Emergency Medicine

## 2022-05-07 DIAGNOSIS — X58XXXA Exposure to other specified factors, initial encounter: Secondary | ICD-10-CM | POA: Insufficient documentation

## 2022-05-07 DIAGNOSIS — H1013 Acute atopic conjunctivitis, bilateral: Secondary | ICD-10-CM | POA: Insufficient documentation

## 2022-05-07 DIAGNOSIS — S0502XA Injury of conjunctiva and corneal abrasion without foreign body, left eye, initial encounter: Secondary | ICD-10-CM | POA: Insufficient documentation

## 2022-05-07 MED ORDER — TETRACAINE HCL 0.5 % OP SOLN
2.0000 [drp] | Freq: Once | OPHTHALMIC | Status: AC
  Administered 2022-05-07: 2 [drp] via OPHTHALMIC
  Filled 2022-05-07: qty 4

## 2022-05-07 MED ORDER — OLOPATADINE HCL 0.1 % OP SOLN: 1.0000 [drp] | Freq: Two times a day (BID) | OPHTHALMIC | 0 refills | Status: DC

## 2022-05-07 MED ORDER — FLUORESCEIN SODIUM 1 MG OP STRP
1.0000 | ORAL_STRIP | Freq: Once | OPHTHALMIC | Status: AC
  Administered 2022-05-07: 1 via OPHTHALMIC
  Filled 2022-05-07: qty 1

## 2022-05-07 MED ORDER — ERYTHROMYCIN 5 MG/GM OP OINT
TOPICAL_OINTMENT | Freq: Four times a day (QID) | OPHTHALMIC | Status: DC
  Administered 2022-05-07: 1 via OPHTHALMIC
  Filled 2022-05-07: qty 3.5

## 2022-05-07 NOTE — ED Triage Notes (Addendum)
Pt c/o dry eyes since Saturday. States that when his eyes get dry it causes his vision to become blurry. Pt has tried eye drops but it only helps for a short time.  ?

## 2022-05-07 NOTE — Discharge Instructions (Addendum)
Please use the eye ointment we gave you in your left eye for the next 2 days ?You can use the prescription for eyedrops in your right eye starting tomorrow. ?After Thursday, you can use the eyedrops in your left eye ?

## 2022-05-07 NOTE — ED Provider Notes (Signed)
?[facility] ?Provider Note ? ? ?CSN: [id number] ?Arrival date & time: 05/07/22  2145 ? ?  ? ?History ? ?Chief Complaint  ?Patient presents with  ? Eye Problem  ? ? ?SAL SPRATLEY is a 26 y.o. male. ? ?The history is provided by the patient.  ?Eye Problem ?Location:  Both eyes ?Severity:  Mild ?Onset quality:  Gradual ?Timing:  Constant ?Progression:  Worsening ?Chronicity:  New ?Context: not burn, not chemical exposure, not contact lens problem, not direct trauma, not foreign body, not using machinery and not scratch   ?Relieved by:  Nothing ?Associated symptoms: blurred vision, discharge and itching   ?Associated symptoms: no decreased vision   ?Patient presents with itching and dry eyes for over a week.  No trauma to the eyes.  He does not wear contact lenses.  No previous eye surgery.  He has not tried any medicines for it except Visine ?  ? ?Home Medications ?Prior to Admission medications   ?Medication Sig Start Date End Date Taking? Authorizing Provider  ?olopatadine (PATADAY) 0.1 % ophthalmic solution Place 1 drop into both eyes 2 (two) times daily. 05/07/22  Yes Zadie Rhine, MD  ?albuterol (VENTOLIN HFA) 108 (90 Base) MCG/ACT inhaler Inhale into the lungs.    [provider]  ?Multiple Vitamin (MULTIVITAMIN) tablet Take by mouth.    [provider]  ?   ? ?Allergies    ?Patient has no known allergies.   ? ?Review of Systems   ?Review of Systems  ?Eyes:  Positive for blurred vision, discharge and itching.  ? ?Physical Exam ?Updated Vital Signs ?BP (!) 154/98 (BP Location: Right Arm)   Pulse (!) 101   Temp 97.9 ?F (36.6 ?C) (Oral)   Resp 17   Ht 1.88 m (6\' 2" )   Wt 90.7 kg   SpO2 99%   BMI 25.68 kg/m?  ?Physical Exam ?CONSTITUTIONAL: Well developed/well nourished ?HEAD AND FACE: Normocephalic/atraumatic ?EYES: EOMI/PERRL, mild conjunctival injection bilaterally.  Small horizontal corneal abrasion OS.  No foreign bodies noted.  No proptosis.  Negative Seidel  test ?ENMT: Mucous membranes moist.  ?NEURO: Pt is awake/alert, moves all extremitiesx4 ?EXTREMITIES:full ROM ?SKIN: warm, color normal ? ?ED Results / Procedures / Treatments   ?Labs ?(all labs ordered are listed, but only abnormal results are displayed) ?Labs Reviewed - No data to display ? ?EKG ?None ? ?Radiology ?No results found. ? ?Procedures ?Procedures  ? ? ?Medications Ordered in ED ?Medications  ?erythromycin ophthalmic ointment (has no administration in time range)  ?fluorescein ophthalmic strip 1 strip (1 strip Both Eyes Given 05/07/22 2349)  ?tetracaine (PONTOCAINE) 0.5 % ophthalmic solution 2 drop (2 drops Both Eyes Given 05/07/22 2350)  ? ? ?ED Course/ Medical Decision Making/ A&P ?  ?                        ?Medical Decision Making ?Risk ?Prescription drug management. ? ? ?Patient likely has allergic conjunctivitis as well as a small abrasion to the left eye. ?He will be given erythromycin for his left eye.  He will also be given eyedrops for allergic conjunctivitis.  Will refer to ophthalmology.  Patient is overall stable for discharge home ? ? ? ? ? ? ? ?Final Clinical Impression(s) / ED Diagnoses ?Final diagnoses:  ?Allergic conjunctivitis of both eyes  ?Abrasion of left cornea, initial encounter  ? ? ?Rx / DC Orders ?ED Discharge Orders   ? ?  Ordered  ?  olopatadine (PATADAY) 0.1 % ophthalmic solution  2 times daily       ? 05/07/22 2350  ? ?  ?  ? ?  ? ? ?  ?Zadie Rhine, MD ?05/07/22 2356 ? ?
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
## 2023-05-17 ENCOUNTER — Encounter: Payer: Self-pay | Admitting: Emergency Medicine

## 2023-05-17 ENCOUNTER — Emergency Department
Admission: EM | Admit: 2023-05-17 | Discharge: 2023-05-17 | Disposition: A | Discharge status: Home / Self Care | Payer: Self-pay | Attending: Emergency Medicine | Admitting: Emergency Medicine

## 2023-05-17 ENCOUNTER — Other Ambulatory Visit: Payer: Self-pay

## 2023-05-17 DIAGNOSIS — Z720 Tobacco use: Secondary | ICD-10-CM | POA: Insufficient documentation

## 2023-05-17 DIAGNOSIS — A64 Unspecified sexually transmitted disease: Secondary | ICD-10-CM

## 2023-05-17 DIAGNOSIS — A749 Chlamydial infection, unspecified: Secondary | ICD-10-CM | POA: Insufficient documentation

## 2023-05-17 DIAGNOSIS — N189 Chronic kidney disease, unspecified: Secondary | ICD-10-CM | POA: Insufficient documentation

## 2023-05-17 DIAGNOSIS — M549 Dorsalgia, unspecified: Secondary | ICD-10-CM | POA: Insufficient documentation

## 2023-05-17 LAB — URINALYSIS, ROUTINE W REFLEX MICROSCOPIC
Bacteria, UA: NONE SEEN
Bilirubin Urine: NEGATIVE
Glucose, UA: NEGATIVE mg/dL
Hgb urine dipstick: NEGATIVE
Ketones, ur: NEGATIVE mg/dL
Nitrite: NEGATIVE
Protein, ur: NEGATIVE mg/dL
Specific Gravity, Urine: 1.015 (ref 1.005–1.030)
pH: 6 (ref 5.0–8.0)

## 2023-05-17 LAB — CHLAMYDIA/NGC RT PCR (ARMC ONLY)
Chlamydia Tr: DETECTED — AB
N gonorrhoeae: NOT DETECTED

## 2023-05-17 MED ORDER — DOXYCYCLINE MONOHYDRATE 100 MG PO TABS: 100.0000 mg | ORAL_TABLET | Freq: Two times a day (BID) | ORAL | 0 refills | Status: DC

## 2023-05-17 MED ORDER — DOXYCYCLINE HYCLATE 100 MG PO TABS
100.0000 mg | ORAL_TABLET | Freq: Once | ORAL | Status: AC
  Administered 2023-05-17: 100 mg via ORAL
  Filled 2023-05-17: qty 1

## 2023-05-17 NOTE — ED Triage Notes (Signed)
Pt here with lower back pain since Tuesday. Pt states he had a new sex partner, denies injury or fall. Pt states it burns when he urinates. Pt denies NVD.

## 2023-05-17 NOTE — ED Provider Notes (Signed)
[facility] Emergency Department Provider Note     Event Date/Time   First MD Initiated Contact with Patient 05/17/23 1457     (approximate)   History   Back Pain   HPI  Tony Pitts is a 27 y.o. male with a past medical history of CKD and tobacco use,that presents to the ED with complaints of back pain. Pt has had 1 new sexual partner last week and used a condom during intercourse that broke. Pt began having clear, milky discharge. Pt also noticed blood in his urine this morning. Pt endorses dysuria. Pt denies, fever, chills, nausea, vomiting, and diarrhea. No loss of bladder or bowel control.       Physical Exam   Triage Vital Signs: ED Triage Vitals  Enc Vitals Group     BP 05/17/23 1246 136/76     Pulse Rate 05/17/23 1245 78     Resp 05/17/23 1245 16     Temp 05/17/23 1245 98.4 F (36.9 C)     Temp Source 05/17/23 1245 Oral     SpO2 05/17/23 1245 95 %     Weight 05/17/23 1245 199 lb 15.3 oz (90.7 kg)     Height 05/17/23 1245 6\' 2"  (1.88 m)     Head Circumference --      Peak Flow --      Pain Score 05/17/23 1245 5     Pain Loc --      Pain Edu? --      Excl. in GC? --     Most recent vital signs: Vitals:   05/17/23 1245 05/17/23 1246  BP:  136/76  Pulse: 78   Resp: 16   Temp: 98.4 F (36.9 C)   SpO2: 95%    General: Well appearing. Alert and oriented. INAD.  Skin:  Warm, dry and intact. No rashes or lesions noted.     Head:  NCAT.  Eyes:  PERRLA. EOMI. Conjunctivae clear. Nose:   Nasal septum is midline. Mucosa is moist. No rhinorrhea. Mouth/Throat:No tonsillar swelling. No erythema. Neck:   Supple.  CV:  Good peripheral perfusion. RRR.  RESP:  Normal effort. LCTAB.   ABD:  No distention. Soft. No tenderness to palpation. No CVA tenderness.  BACK:  Spinous process is midline without deformity or tenderness.  MSK:   Patient freely moves all extremities. No warm swollen joints.  GU:   Deferred    ED Results /  Procedures / Treatments   Labs (all labs ordered are listed, but only abnormal results are displayed) Labs Reviewed  CHLAMYDIA/NGC RT PCR ([facility] ONLY)           - Abnormal; Notable for the following components:      Result Value   Chlamydia Tr DETECTED (*)    All other components within normal limits  URINALYSIS, ROUTINE W REFLEX MICROSCOPIC - Abnormal; Notable for the following components:   Color, Urine YELLOW (*)    APPearance CLEAR (*)    Leukocytes,Ua SMALL (*)    All other components within normal limits    PROCEDURES:  Critical Care performed: No  Procedures   MEDICATIONS ORDERED IN ED: Medications  doxycycline (VIBRA-TABS) tablet 100 mg (100 mg Oral Given 05/17/23 1543)     IMPRESSION / MDM / ASSESSMENT AND PLAN / ED COURSE  I reviewed the triage vital signs and the nursing notes.  Differential diagnosis includes, but is not limited to, STI, nephrolithiasis, pyelonephritis, and bladder cancer highly unlikely but still considered.  Patient's presentation is most consistent with acute complicated illness / injury requiring diagnostic workup.  Patient's diagnosis is consistent with chlamydia. Patient will be discharged home with prescriptions for doxycycline. Patient is to follow up with primary as needed or otherwise directed. Patient is given ED precautions to return to the ED for any worsening or new symptoms.  Clinical Course as of 05/17/23 2043  Fri May 17, 2023  1515 New sexual partner l ast week.  Sx began on Tuesday. Discharge.  Clear. [MH]    Clinical Course User Index [MH] Romeo Apple, Armany Mano A, PA-C    FINAL CLINICAL IMPRESSION(S) / ED DIAGNOSES   Final diagnoses:  STD (male)     Rx / DC Orders   ED Discharge Orders          Ordered    doxycycline (ADOXA) 100 MG tablet  2 times daily        05/17/23 1526             Note:  This document was prepared using Dragon voice recognition software and may include  unintentional dictation errors.    Romeo Apple, Nakeyia Menden A, PA-C 05/17/23 2050    Sharman Cheek, MD 05/20/23 [phone]

## 2023-08-13 ENCOUNTER — Other Ambulatory Visit: Payer: Self-pay

## 2023-08-13 ENCOUNTER — Emergency Department
Admission: EM | Admit: 2023-08-13 | Discharge: 2023-08-13 | Disposition: A | Discharge status: Home / Self Care | Payer: Self-pay | Attending: Emergency Medicine | Admitting: Emergency Medicine

## 2023-08-13 ENCOUNTER — Encounter: Payer: Self-pay | Admitting: Emergency Medicine

## 2023-08-13 DIAGNOSIS — U071 COVID-19: Secondary | ICD-10-CM | POA: Diagnosis not present

## 2023-08-13 DIAGNOSIS — N189 Chronic kidney disease, unspecified: Secondary | ICD-10-CM | POA: Insufficient documentation

## 2023-08-13 LAB — SARS CORONAVIRUS 2 BY RT PCR: SARS Coronavirus 2 by RT PCR: POSITIVE — AB

## 2023-08-13 NOTE — ED Triage Notes (Signed)
Patient arrives ambulatory by POV c/o headache, chills, sinus pressure, cough and chest congestion onset of Friday. Denies any known sick contacts. States work will not let him return until he is evaluated.

## 2023-08-13 NOTE — ED Notes (Signed)
See triage note  Presents with cough,h/a and subjective  States sx's started on Friday

## 2023-08-13 NOTE — ED Notes (Signed)
D/C and OTC meds discussed with pt as well as wearing a mask in public for the next 3 days. NAD noted. Pt ambulatory with steady gait on D/C.

## 2023-08-13 NOTE — ED Provider Notes (Signed)
[facility] Provider Note    Event Date/Time   First MD Initiated Contact with Patient 08/13/23 [phone]     (approximate)   History   Headache and Cough   HPI  Tony Pitts is a 27 y.o. male with history of CKD and liver mass presents emergency department with complaints of runny nose, headache, cough and congestion and sinus pressure since Friday.  Unsure of sick contacts.  States his employer needs a note stating when he can return to work and excuse for days prior.  Denies fever.  No vomiting or diarrhea.  Patient did not get vaccinated for COVID      Physical Exam   Triage Vital Signs: ED Triage Vitals [08/13/23 0827]  Encounter Vitals Group     BP (!) 142/73     Systolic BP Percentile      Diastolic BP Percentile      Pulse Rate 81     Resp 16     Temp 98.3 F (36.8 C)     Temp Source Oral     SpO2 100 %     Weight 210 lb (95.3 kg)     Height 6\' 2"  (1.88 m)     Head Circumference      Peak Flow      Pain Score 0     Pain Loc      Pain Education      Exclude from Growth Chart     Most recent vital signs: Vitals:   08/13/23 0827 08/13/23 0900  BP: (!) 142/73 137/75  Pulse: 81 68  Resp: 16 18  Temp: 98.3 F (36.8 C)   SpO2: 100% 100%     General: Awake, no distress.   CV:  Good peripheral perfusion. regular rate and  rhythm Resp:  Normal effort. Lungs CTA Abd:  No distention.   Other:      ED Results / Procedures / Treatments   Labs (all labs ordered are listed, but only abnormal results are displayed) Labs Reviewed  SARS CORONAVIRUS 2 BY RT PCR - Abnormal; Notable for the following components:      Result Value   SARS Coronavirus 2 by RT PCR POSITIVE (*)    All other components within normal limits     EKG     RADIOLOGY     PROCEDURES:   Procedures   MEDICATIONS ORDERED IN ED: Medications - No data to display   IMPRESSION / MDM / ASSESSMENT AND PLAN / ED COURSE  I reviewed the triage vital  signs and the nursing notes.                              Differential diagnosis includes, but is not limited to, COVID, viral illness, acute bronchitis, seasonal allergies  Patient's presentation is most consistent with acute illness / injury with system symptoms.   Respiratory panel obtained  COVID test is positive  Did explain the findings to the patient.  He is to quarantine for another 5 days.  He can return to work and wear a mask on day 7.  Given a work note explaining the same.  Over-the-counter medications for cough and congestion.  Return emergency department worsening.  He is in agreement treatment plan.  Discharged stable condition.     FINAL CLINICAL IMPRESSION(S) / ED DIAGNOSES   Final diagnoses:  COVID     Rx / DC Orders  ED Discharge Orders     None        Note:  This document was prepared using Dragon voice recognition software and may include unintentional dictation errors.    Faythe Ghee, PA-C 08/13/23 1140    Jene Every, MD 08/13/23 [phone]

## 2023-08-13 NOTE — Discharge Instructions (Signed)
Otc medication for cough and congestion Wear a mask when in public for the next 5 days Return to the ER if worsening

## 2023-08-19 ENCOUNTER — Emergency Department
Admission: EM | Admit: 2023-08-19 | Discharge: 2023-08-19 | Disposition: A | Discharge status: Home / Self Care | Payer: Self-pay

## 2023-08-19 NOTE — ED Notes (Signed)
Pt was not seen in the ED today pt dismissed

## 2023-08-22 ENCOUNTER — Emergency Department
Admission: EM | Admit: 2023-08-22 | Discharge: 2023-08-22 | Disposition: A | Discharge status: Home / Self Care | Payer: Self-pay | Attending: Emergency Medicine | Admitting: Emergency Medicine

## 2023-08-22 ENCOUNTER — Encounter: Payer: Self-pay | Admitting: Emergency Medicine

## 2023-08-22 ENCOUNTER — Other Ambulatory Visit: Payer: Self-pay

## 2023-08-22 DIAGNOSIS — N189 Chronic kidney disease, unspecified: Secondary | ICD-10-CM | POA: Insufficient documentation

## 2023-08-22 DIAGNOSIS — K529 Noninfective gastroenteritis and colitis, unspecified: Secondary | ICD-10-CM | POA: Insufficient documentation

## 2023-08-22 DIAGNOSIS — Z8616 Personal history of COVID-19: Secondary | ICD-10-CM | POA: Insufficient documentation

## 2023-08-22 NOTE — Discharge Instructions (Signed)
Please return if you develop any new, worsening, or change in symptoms or other concerns.

## 2023-08-22 NOTE — ED Provider Notes (Signed)
[facility] Provider Note    Event Date/Time   First MD Initiated Contact with Patient 08/22/23 [phone]     (approximate)   History   Letter for School/Work   HPI  Tony Pitts is a 27 y.o. male who presents today with request for work note.  Patient reports that he was diagnosed with COVID-19 on 08/13/2023 and stayed out of work as he was supposed to.  He reports that he returned for work on Monday and Tuesday and felt well, but yesterday he had food poisoning which he thinks was from [employer] and called out of work yesterday.  His work is now requesting that he get cleared to return to work.  Patient reports that all of his symptoms have resolved entirely.  He did not have any abdominal pain, blood in his stool, fevers, chills.  He reports that he is now able to eat and drink normally and feels back to his normal self.  He has no complaints today with the exception of wanting a work note.  Patient Active Problem List   Diagnosis Date Noted   Chronic kidney disease 02/25/2020   Liver mass 10/30/2018   Epigastric pain 10/30/2018   Tobacco user 12/28/2016          Physical Exam   Triage Vital Signs: ED Triage Vitals [08/22/23 0744]  Encounter Vitals Group     BP 128/76     Systolic BP Percentile      Diastolic BP Percentile      Pulse Rate 91     Resp 18     Temp 98.2 F (36.8 C)     Temp Source Oral     SpO2 99 %     Weight 210 lb (95.3 kg)     Height 6\' 2"  (1.88 m)     Head Circumference      Peak Flow      Pain Score 0     Pain Loc      Pain Education      Exclude from Growth Chart     Most recent vital signs: Vitals:   08/22/23 0744  BP: 128/76  Pulse: 91  Resp: 18  Temp: 98.2 F (36.8 C)  SpO2: 99%    Physical Exam Vitals and nursing note reviewed.  Constitutional:      General: Awake and alert. No acute distress.    Appearance: Normal appearance. The patient is normal weight.  HENT:     Head: Normocephalic  and atraumatic.     Mouth: Mucous membranes are moist.  Eyes:     General: PERRL. Normal EOMs        Right eye: No discharge.        Left eye: No discharge.     Conjunctiva/sclera: Conjunctivae normal.  Cardiovascular:     Rate and Rhythm: Normal rate and regular rhythm.     Pulses: Normal pulses.  Pulmonary:     Effort: Pulmonary effort is normal. No respiratory distress.     Breath sounds: Normal breath sounds.  Abdominal:     Abdomen is soft. There is no abdominal tenderness. No rebound or guarding. No distention. Musculoskeletal:        General: No swelling. Normal range of motion.     Cervical back: Normal range of motion and neck supple.  Skin:    General: Skin is warm and dry.     Capillary Refill: Capillary refill takes less than 2 seconds.  Findings: No rash.  Neurological:     Mental Status: The patient is awake and alert.      ED Results / Procedures / Treatments   Labs (all labs ordered are listed, but only abnormal results are displayed) Labs Reviewed - No data to display   EKG     RADIOLOGY     PROCEDURES:  Critical Care performed:   Procedures   MEDICATIONS ORDERED IN ED: Medications - No data to display   IMPRESSION / MDM / ASSESSMENT AND PLAN / ED COURSE  I reviewed the triage vital signs and the nursing notes.   Differential diagnosis includes, but is not limited to, need for work note, gastroenteritis, COVID-19.  I reviewed the patient's chart.  Patient was seen in the emergency department on 08/13/2023 and diagnosed with COVID-19.  Patient is awake and alert, hemodynamically stable and afebrile. He is nontoxic in appearance.  He demonstrates no acute distress.  He has no abdominal tenderness.  He reports that all of his symptoms have resolved.  He denies any complaints currently does not want any workup today.  He is here for work note only.  He has completed his COVID quarantine period, and subsequently developed what sounds like a  gastroenteritis, which has subsequently resolved.  He does not appear to be dehydrated.  He is able to tolerate p.o.  There was no blood in his stool.  He has no abdominal tenderness on exam or complaints of abdominal pain.  I feel that he may return to work without restrictions unless he develops new symptoms or recurrence of his symptoms.  He was given a work note as requested to return to work today.  He was discharged in stable condition.   Patient's presentation is most consistent with acute, uncomplicated illness.   FINAL CLINICAL IMPRESSION(S) / ED DIAGNOSES   Final diagnoses:  Gastroenteritis     Rx / DC Orders   ED Discharge Orders     None        Note:  This document was prepared using Dragon voice recognition software and may include unintentional dictation errors.   Keturah Shavers 08/22/23 0820    Chesley Noon, MD 08/22/23 1455

## 2023-08-22 NOTE — ED Triage Notes (Signed)
Patient to ED via POV for work note. Patient states he was dx with covid last week and returned to work on Monday and Tuesday. PT had food poisoning yesterday- now work is requiring him to be seen to "see if I am okay." Patient has no complaints at this time.

## 2023-08-27 ENCOUNTER — Encounter (HOSPITAL_COMMUNITY): Payer: Self-pay | Admitting: *Deleted

## 2023-08-27 ENCOUNTER — Emergency Department (HOSPITAL_COMMUNITY)
Admission: EM | Admit: 2023-08-27 | Discharge: 2023-08-27 | Disposition: A | Discharge status: Home / Self Care | Payer: Self-pay | Attending: Emergency Medicine | Admitting: Emergency Medicine

## 2023-08-27 ENCOUNTER — Other Ambulatory Visit: Payer: Self-pay

## 2023-08-27 ENCOUNTER — Emergency Department (HOSPITAL_COMMUNITY): Payer: Self-pay

## 2023-08-27 DIAGNOSIS — Z8616 Personal history of COVID-19: Secondary | ICD-10-CM | POA: Insufficient documentation

## 2023-08-27 DIAGNOSIS — I301 Infective pericarditis: Secondary | ICD-10-CM

## 2023-08-27 DIAGNOSIS — R0789 Other chest pain: Secondary | ICD-10-CM | POA: Diagnosis not present

## 2023-08-27 DIAGNOSIS — R079 Chest pain, unspecified: Secondary | ICD-10-CM

## 2023-08-27 DIAGNOSIS — B3323 Viral pericarditis: Secondary | ICD-10-CM | POA: Insufficient documentation

## 2023-08-27 DIAGNOSIS — I309 Acute pericarditis, unspecified: Secondary | ICD-10-CM | POA: Diagnosis not present

## 2023-08-27 DIAGNOSIS — B349 Viral infection, unspecified: Secondary | ICD-10-CM | POA: Diagnosis not present

## 2023-08-27 LAB — BASIC METABOLIC PANEL
Anion gap: 6 (ref 5–15)
BUN: 14 mg/dL (ref 6–20)
CO2: 25 mmol/L (ref 22–32)
Calcium: 8.9 mg/dL (ref 8.9–10.3)
Chloride: 107 mmol/L (ref 98–111)
Creatinine, Ser: 1.31 mg/dL — ABNORMAL HIGH (ref 0.61–1.24)
GFR, Estimated: >60 mL/min (ref >60)
Glucose, Bld: 81 mg/dL (ref 70–99)
Potassium: 3.8 mmol/L (ref 3.5–5.1)
Sodium: 138 mmol/L (ref 135–145)

## 2023-08-27 LAB — CBC
HCT: 42.9 % (ref 39.0–52.0)
Hemoglobin: 14.4 g/dL (ref 13.0–17.0)
MCH: 31.3 pg (ref 26.0–34.0)
MCHC: 33.6 g/dL (ref 30.0–36.0)
MCV: 93.3 fL (ref 80.0–100.0)
Platelets: 247 10*3/uL (ref 150–400)
RBC: 4.6 MIL/uL (ref 4.22–5.81)
RDW: 12.7 % (ref 11.5–15.5)
WBC: 4.6 10*3/uL (ref 4.0–10.5)
nRBC: 0 % (ref 0.0–0.2)

## 2023-08-27 LAB — TROPONIN I (HIGH SENSITIVITY)
Troponin I (High Sensitivity): 3 ng/L (ref <18)
Troponin I (High Sensitivity): 2 ng/L (ref <18)

## 2023-08-27 LAB — D-DIMER, QUANTITATIVE: D-Dimer, Quant: <0.27 ug{FEU}/mL (ref 0.00–0.50)

## 2023-08-27 MED ORDER — KETOROLAC TROMETHAMINE 15 MG/ML IJ SOLN
15.0000 mg | Freq: Once | INTRAMUSCULAR | Status: AC
  Administered 2023-08-27: 15 mg via INTRAVENOUS
  Filled 2023-08-27: qty 1

## 2023-08-27 MED ORDER — ASPIRIN 81 MG PO CHEW
324.0000 mg | CHEWABLE_TABLET | ORAL | Status: AC
  Administered 2023-08-27: 324 mg via ORAL
  Filled 2023-08-27: qty 4

## 2023-08-27 MED ORDER — COLCHICINE 0.6 MG PO TABS: 0.6000 mg | ORAL_TABLET | Freq: Two times a day (BID) | ORAL | 1 refills | Status: DC

## 2023-08-27 MED ORDER — PREDNISONE 10 MG (21) PO TBPK: ORAL_TABLET | Freq: Every day | ORAL | 0 refills | Status: AC

## 2023-08-27 NOTE — ED Triage Notes (Signed)
Pt diagnosed with Covid 2 weeks ago. Pt reports he has been having chest pain since he was diagnosed but his doctor told him it should get better as he got over Covid. He reports the pain has only gotten worse and over the last few days he has gotten SOB and had tingling in his left fingertips.

## 2023-08-27 NOTE — Discharge Instructions (Addendum)
You were seen for your chest pain in the emergency department. It is likely from pericarditis.   At home, please take ibuprofen and Tylenol for your pain.  Please also take the colchicine and prednisone for your pain.    Follow-up with your primary doctor in 2-3 days regarding your visit.  Cardiology will be calling you regarding an appointment within the next 72 hours.  You may contact them if you do not hear from them in that time using the information in this packet.  Return immediately to the emergency department if you experience any of the following: Worsening pain, difficulty breathing, unexplained vomiting or sweating, or any other concerning symptoms.    Thank you for visiting our Emergency Department. It was a pleasure taking care of you today.

## 2023-08-27 NOTE — ED Provider Notes (Signed)
[facility] EMERGENCY DEPARTMENT AT [facility] Provider Note   CSN: [id number] Arrival date & time: 08/27/23  0830     History {Add pertinent medical, surgical, social history, OB history to HPI:1} No chief complaint on file.   Tony Pitts is a 27 y.o. male.  27 year old male with a history of vape use and recent diagnosis of COVID who presents to the emergency department with chest pain.  Patient reports that 2 weeks ago he started developing symptoms of COVID.  Said that he started developing left-sided chest pain that radiates to his left neck and left ribs.  Describes it as sharp with movement but then becomes a dull pain afterwards.  It is positional, pleuritic, and exertional.  Also has been having some dyspnea on exertion as well.  No fevers or chills and feels that the rest of his COVID symptoms have gone aside from the chest pain and shortness of breath.  No leg swelling.  No history of DVT or PE or personal history of MI.  No family history of MI.  No diaphoresis or vomiting recently.  Says that the pain is currently 2/10 in severity.  Denies cocaine or other substance use.       Home Medications Prior to Admission medications   Medication Sig Start Date End Date Taking? Authorizing Provider  albuterol (VENTOLIN HFA) 108 (90 Base) MCG/ACT inhaler Inhale into the lungs.    [provider]  Multiple Vitamin (MULTIVITAMIN) tablet Take by mouth.    [provider]  olopatadine (PATADAY) 0.1 % ophthalmic solution Place 1 drop into both eyes 2 (two) times daily. 05/07/22   Zadie Rhine, MD      Allergies    Patient has no known allergies.    Review of Systems   Review of Systems  Physical Exam Updated Vital Signs BP (!) 140/82   Pulse 62   Temp 98.9 F (37.2 C) (Oral)   Resp 15   Ht 6\' 2"  (1.88 m)   Wt 92.3 kg   SpO2 98%   BMI 26.11 kg/m  Physical Exam Vitals and nursing note reviewed.  Constitutional:      General: He is not  in acute distress.    Appearance: He is well-developed.  HENT:     Head: Normocephalic and atraumatic.     Right Ear: External ear normal.     Left Ear: External ear normal.     Nose: Nose normal.  Eyes:     Extraocular Movements: Extraocular movements intact.     Conjunctiva/sclera: Conjunctivae normal.     Pupils: Pupils are equal, round, and reactive to light.  Cardiovascular:     Rate and Rhythm: Normal rate and regular rhythm.     Heart sounds: Normal heart sounds.     Comments: Chest pain not reproducible.  No rash. Pulmonary:     Effort: Pulmonary effort is normal. No respiratory distress.     Breath sounds: Normal breath sounds.  Abdominal:     General: There is no distension.     Palpations: Abdomen is soft. There is no mass.     Tenderness: There is no abdominal tenderness. There is no guarding.  Musculoskeletal:     Cervical back: Normal range of motion and neck supple.     Right lower leg: No edema.     Left lower leg: No edema.  Skin:    General: Skin is warm and dry.  Neurological:     Mental Status: He  is alert. Mental status is at baseline.  Psychiatric:        Mood and Affect: Mood normal.        Behavior: Behavior normal.     ED Results / Procedures / Treatments   Labs (all labs ordered are listed, but only abnormal results are displayed) Labs Reviewed  BASIC METABOLIC PANEL  CBC  D-DIMER, QUANTITATIVE  TROPONIN I (HIGH SENSITIVITY)    EKG None  Radiology No results found.  Procedures Procedures  {Document cardiac monitor, telemetry assessment procedure when appropriate:1}  Medications Ordered in ED Medications  aspirin chewable tablet 324 mg (has no administration in time range)    ED Course/ Medical Decision Making/ A&P   {   Click here for ABCD2, HEART and other calculatorsREFRESH Note before signing :1}                              Medical Decision Making Amount and/or Complexity of Data Reviewed Labs: ordered. Radiology:  ordered.  Risk OTC drugs.   ***  {Document critical care time when appropriate:1} {Document review of labs and clinical decision tools ie heart score, Chads2Vasc2 etc:1}  {Document your independent review of radiology images, and any outside records:1} {Document your discussion with family members, caretakers, and with consultants:1} {Document social determinants of health affecting pt's care:1} {Document your decision making why or why not admission, treatments were needed:1} Final Clinical Impression(s) / ED Diagnoses Final diagnoses:  None    Rx / DC Orders ED Discharge Orders     None

## 2023-08-27 NOTE — ED Notes (Signed)
Patient Alert and oriented to baseline. Stable and ambulatory to baseline. Patient verbalized understanding of the discharge instructions.  Patient belongings were taken by the patient.   

## 2023-09-09 ENCOUNTER — Other Ambulatory Visit: Payer: Self-pay

## 2023-09-09 DIAGNOSIS — R079 Chest pain, unspecified: Secondary | ICD-10-CM | POA: Insufficient documentation

## 2023-09-09 DIAGNOSIS — N200 Calculus of kidney: Secondary | ICD-10-CM | POA: Insufficient documentation

## 2023-09-09 DIAGNOSIS — I301 Infective pericarditis: Secondary | ICD-10-CM | POA: Insufficient documentation

## 2023-09-10 ENCOUNTER — Ambulatory Visit: Payer: BC Managed Care – PPO

## 2023-09-10 VITALS — BP 128/78 | HR 64 | Ht 74.0 in | Wt 204.8 lb

## 2023-09-10 DIAGNOSIS — I301 Infective pericarditis: Secondary | ICD-10-CM | POA: Diagnosis not present

## 2023-09-10 DIAGNOSIS — I491 Atrial premature depolarization: Secondary | ICD-10-CM | POA: Diagnosis not present

## 2023-09-10 DIAGNOSIS — F17290 Nicotine dependence, other tobacco product, uncomplicated: Secondary | ICD-10-CM

## 2023-09-10 NOTE — Addendum Note (Signed)
Addended by: Roxanne Mins I on: 09/10/2023 03:46 PM   Modules accepted: Orders

## 2023-09-10 NOTE — Progress Notes (Signed)
Cardiology Consultation:    Date:  09/10/2023   ID:  Tony Pitts, DOB 09/11/1996, MRN [id number]  PCP:  [facility]  Cardiologist:  Marlyn Corporal Sinahi Knights, MD   Referring MD: Tony Baton, MD   No chief complaint on file. pericarditis   ASSESSMENT AND PLAN:   [name] 27 year old male patient without significant prior cardiac history, has social history significant for prior tobacco smoking, now using nicotine vape, smokes marijuana 2-3 times a day, works in Counselling psychologist, recently after a COVID-19 illness 3 weeks ago, had precordial pain related visit to the ER diagnosed with pericarditis, symptoms improved with Toradol subsequently Aleve and was started on colchicine and 12-day regimen of prednisone. Here for evaluation with cardiology in the office and requesting clearance for work.   Problem List Items Addressed This Visit     Acute viral pericarditis - Primary    Symptoms resolved.  Completed steroid course of 12 days. Unclear if symptoms were truly secondary to pericarditis. Advised him about potential for recurrent pericarditis in people who have been treated with steroids with their initial bout of acute pericarditis.    Continue colchicine 0.6 mg twice daily dose for 36-month course, tentatively discontinue around Thanksgiving.      Relevant Orders   EKG 12-Lead (Completed)   Ectopic atrial rhythm    Occasional ectopic atrial beats with sinus arrhythmia observed on EKG. Asymptomatic Good functional status at baseline.  Will obtain thyroid panel. Advised him to avoid smoking marijuana and also avoid vaping nicotine.  Will obtain a treadmill EKG stress test to assess his exercise capacity and tolerance and to rule out any significant triggered arrhythmias before we can comment on his work release.      Relevant Orders   TSH+T4F+T3Free   Vaping nicotine dependence, tobacco product    Advised to quit.      Return to  clinic as needed.  History of Present Illness:    Tony Pitts is a 27 y.o. male who is being seen today for the evaluation of recent viral pericarditis at the request of Tony Baton, MD.  He had recently recovered from an infection of SARS-CoV-2/COVID-19 [around August 13], does use vape,  Was recently at emergency room August 27, 2023 at [facility] for chest pain.  EKG was felt to be concerning for pericarditis with mild ST elevation and PR depression.  Serial troponins were unremarkable.  D-dimer was low.  Chest x-ray without any significant infiltrates.  Symptoms improved with Toradol.  Overall suspicion was symptoms were secondary to postviral pericarditis and was given a prescription for colchicine and steroids [completed 12-day course of this] along with Aleve on an as-needed basis.  Here for the visit today in the office he mentions his symptoms have improved. Describes the pain prompting the visit to the ER was chest discomfort associated with radiation to the axilla and the back on the left side and up to the left shoulder.  Symptoms improved after the treatment with colchicine, steroids.  He does have mild left shoulder and left upper arm achiness.  Not reproducible.  Denies any further palpitation, lightheadedness, dizziness episodes.  Continues to vape nicotine. Continues to smoke marijuana 2-3 times a day. No alcohol abuse history. Exercises running up to 1 mile a day until the COVID-19 infection.   He works for a [employer] and job involves lifting heavy [employer] and significant physical exertion.  He was able to do all this without  any limitations until the COVID-19 infection after which he is been holding off on physical exertion as instructed since the ER visit on 27th.  Is requesting clearance to return to work.  EKG here in the clinic today shows ectopic atrial rhythm with short PR interval and wandering P wave morphology suggestive of wandering  atrial pacemaker., QRS duration 90 ms.  Early repolarization changes. Prior EKG from August 27 was similar. His previous EKGs from 2017 and 2015 also showed sinus arrhythmia versus wandering ectopic atrial pacemaker.    Past Medical History:  Diagnosis Date   Acute viral pericarditis    Chest pain    Chronic kidney disease 02/25/2020   Epigastric pain 10/30/2018   Formatting of this note might be different from the original.  Last Assessment & Plan:   Suspect gastritis with recent significant alcohol use.  Patient aware that he should use alcohol in moderation.  Avoid excessive use.  He does not drink on a regular basis.  We will try pantoprazole 40 mg 30 minutes before breakfast for the next 30 days.  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  Last Assessment & Plan:   Suspected hemangioma.  MRI is recommended by radiology.  Provided reassurance to patient and his mother today.     Tobacco user 12/28/2016    Past Surgical History:  Procedure Laterality Date   ANTERIOR CRUCIATE LIGAMENT REPAIR Right     Current Medications: Current Meds  Medication Sig   colchicine 0.6 MG tablet Take 1 tablet (0.6 mg total) by mouth 2 (two) times daily.     Allergies:   Patient has no known allergies.   Social History   Socioeconomic History   Marital status: Single    Spouse name: Not on file   Number of children: 0   Years of education: 13   Highest education level: Not on file  Occupational History   Occupation: Naval architect   Occupation: student    Comment: Event organiser  Tobacco Use   Smoking status: Former    Current packs/day: 0.25    Average packs/day: 0.3 packs/day for 10.0 years (2.5 ttl pk-yrs)    Types: Cigarettes   Smokeless tobacco: Never   Tobacco comments:    1-2 black and milds daily  Vaping Use   Vaping status: Every Day   Substances: Nicotine   Substance and Sexual Activity   Alcohol use: Not Currently    Comment: once every 3-4 months   Drug use: Not Currently    Frequency: 7.0 times per week    Types: Marijuana   Sexual activity: Yes    Birth control/protection: Condom  Other Topics Concern   Not on file  Social History Narrative   Lives with Mom, siblings and grandmother   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 ROS:  Please see the history of present illness.    All 14 point review of systems negative except as described per history of present illness.  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 Abnormal ECG When compared with ECG of 27-Aug-2023 08:59, PREVIOUS ECG IS PRESENT Confirmed by Huntley Dec reddy [phone]) on 09/10/2023 2:57:30 PM    Recent Labs: 08/27/2023: BUN 14; Creatinine, Ser 1.31; Hemoglobin 14.4; Platelets 247; Potassium 3.8; Sodium 138  Recent Lipid Panel No results found for: "CHOL", "TRIG", "HDL", "CHOLHDL", "VLDL", "LDLCALC", "LDLDIRECT"  Physical Exam:    VS:  BP 128/78   Pulse 64   Ht 6\' 2"  (1.88 m)   Wt 204 lb 12.8 oz (92.9  kg)   SpO2 99%   BMI 26.29 kg/m     Wt Readings from Last 3 Encounters:  09/10/23 204 lb 12.8 oz (92.9 kg)  08/27/23 203 lb 6.2 oz (92.3 kg)  08/22/23 210 lb (95.3 kg)     GENERAL:  Well nourished, well developed in no acute distress NECK: No JVD; No carotid bruits CARDIAC: RRR, S1 and S2 present, no murmurs, no rubs, no gallops CHEST:  Clear to auscultation without rales, wheezing or rhonchi  Extremities: No pitting pedal edema. Pulses bilaterally symmetric with radial 2+ and dorsalis pedis 2+ NEUROLOGIC:  Alert and oriented x 3  Medication Adjustments/Labs and Tests Ordered: Current medicines are reviewed at length with the patient today.  Concerns regarding medicines are outlined above.  Orders Placed This Encounter  Procedures   EKG 12-Lead   No orders of the defined types were placed in this encounter.   Signed, Essie Lagunes reddy Ketra Duchesne, MD, MPH, [facility]. 09/10/2023 2:50 PM    [facility]

## 2023-09-10 NOTE — Assessment & Plan Note (Signed)
Advised to quit.  

## 2023-09-10 NOTE — Assessment & Plan Note (Signed)
Symptoms resolved.  Completed steroid course of 12 days. Unclear if symptoms were truly secondary to pericarditis. Advised him about potential for recurrent pericarditis in people who have been treated with steroids with their initial bout of acute pericarditis.    Continue colchicine 0.6 mg twice daily dose for 21-month course, tentatively discontinue around Thanksgiving.

## 2023-09-10 NOTE — Patient Instructions (Signed)
Medication Instructions:  Your physician has recommended you make the following change in your medication:   STOP: Colchicine at Thansgiving time  *If you need a refill on your cardiac medications before your next appointment, please call your pharmacy*   Lab Work: Your physician recommends that you return for lab work in:   Labs today: TSH, T3, T4  If you have labs (blood work) drawn today and your tests are completely normal, you will receive your results only by: MyChart Message (if you have MyChart) OR A paper copy in the mail If you have any lab test that is abnormal or we need to change your treatment, we will call you to review the results.   Testing/Procedures: Treadmill Stress Test Instructions:    1. You may take all of your medications.  2. No food, drink or tobacco products 2 hours prior to your test.  3. Dress prepared to exercise. Best to wear 2 piece outfit and tennis shoes. Shoes must be closed toe.  4. Please bring all current prescription medications.    Follow-Up: At [facility], you and your health needs are our priority.  As part of our continuing mission to provide you with exceptional heart care, we have created designated Provider Care Teams.  These Care Teams include your primary Cardiologist (physician) and Advanced Practice Providers (APPs -  Physician Assistants and Nurse Practitioners) who all work together to provide you with the care you need, when you need it.  We recommend signing up for the patient portal called "MyChart".  Sign up information is provided on this After Visit Summary.  MyChart is used to connect with patients for Virtual Visits (Telemedicine).  Patients are able to view lab/test results, encounter notes, upcoming appointments, etc.  Non-urgent messages can be sent to your provider as well.   To learn more about what you can do with MyChart, go to [email or web address].    Your next appointment:   Follow up as  needed  Provider:   Huntley Dec, MD    Other Instructions None

## 2023-09-10 NOTE — Assessment & Plan Note (Signed)
Occasional ectopic atrial beats with sinus arrhythmia observed on EKG. Asymptomatic Good functional status at baseline.  Will obtain thyroid panel. Advised him to avoid smoking marijuana and also avoid vaping nicotine.  Will obtain a treadmill EKG stress test to assess his exercise capacity and tolerance and to rule out any significant triggered arrhythmias before we can comment on his work release.

## 2023-09-11 ENCOUNTER — Ambulatory Visit: Payer: BC Managed Care – PPO

## 2023-09-11 DIAGNOSIS — I491 Atrial premature depolarization: Secondary | ICD-10-CM | POA: Diagnosis not present

## 2023-09-11 DIAGNOSIS — F17290 Nicotine dependence, other tobacco product, uncomplicated: Secondary | ICD-10-CM | POA: Diagnosis not present

## 2023-09-11 DIAGNOSIS — I301 Infective pericarditis: Secondary | ICD-10-CM | POA: Diagnosis not present

## 2023-09-11 LAB — EXERCISE TOLERANCE TEST
Angina Index: 0
Duke Treadmill Score: 13
Estimated workload: 17.2
Exercise duration (min): 13 min
Exercise duration (sec): 11 s
MPHR: 192 {beats}/min
Peak HR: 196 {beats}/min
Percent HR: 102 %
Rest HR: 71 {beats}/min
ST Depression (mm): 0 mm

## 2023-09-11 LAB — TSH+T4F+T3FREE
Free T4: 1.71 ng/dL (ref 0.82–1.77)
T3, Free: 5.9 pg/mL — ABNORMAL HIGH (ref 2.0–4.4)
TSH: 4.19 u[IU]/mL (ref 0.450–4.500)

## 2023-09-11 NOTE — Progress Notes (Signed)
Send a note on MyChart regarding his blood work results for thyroid hormone and treadmill exercise stress test results.  Both these tests were unremarkable without any major abnormality.  He will need to follow-up with his primary care to recheck his thyroid panel blood work in few weeks.  Please forward these results to his PCP.   From cardiac standpoint he is okay to proceed with his work without any limitations.

## 2023-10-29 ENCOUNTER — Other Ambulatory Visit: Payer: Self-pay

## 2023-10-29 ENCOUNTER — Emergency Department
Admission: EM | Admit: 2023-10-29 | Discharge: 2023-10-29 | Disposition: A | Discharge status: Home / Self Care | Payer: BC Managed Care – PPO | Attending: Emergency Medicine | Admitting: Emergency Medicine

## 2023-10-29 DIAGNOSIS — R0981 Nasal congestion: Secondary | ICD-10-CM | POA: Diagnosis not present

## 2023-10-29 DIAGNOSIS — Z1152 Encounter for screening for COVID-19: Secondary | ICD-10-CM | POA: Insufficient documentation

## 2023-10-29 DIAGNOSIS — J0100 Acute maxillary sinusitis, unspecified: Secondary | ICD-10-CM | POA: Diagnosis not present

## 2023-10-29 DIAGNOSIS — J3489 Other specified disorders of nose and nasal sinuses: Secondary | ICD-10-CM

## 2023-10-29 DIAGNOSIS — Z79899 Other long term (current) drug therapy: Secondary | ICD-10-CM | POA: Diagnosis not present

## 2023-10-29 LAB — SARS CORONAVIRUS 2 BY RT PCR: SARS Coronavirus 2 by RT PCR: NEGATIVE

## 2023-10-29 MED ORDER — FLUTICASONE PROPIONATE 50 MCG/ACT NA SUSP: 1.0000 | Freq: Every day | NASAL | 2 refills | Status: DC

## 2023-10-29 MED ORDER — AMOXICILLIN-POT CLAVULANATE 875-125 MG PO TABS: 1.0000 | ORAL_TABLET | Freq: Two times a day (BID) | ORAL | 0 refills | Status: AC

## 2023-10-29 MED ORDER — CLARITIN-D 12 HOUR 5-120 MG PO TB12: 1.0000 | ORAL_TABLET | Freq: Two times a day (BID) | ORAL | 0 refills | Status: DC

## 2023-10-29 NOTE — ED Provider Notes (Signed)
[place] EMERGENCY DEPARTMENT AT [facility] Provider Note   CSN: [id number] Arrival date & time: 10/29/23  1755     History  Chief Complaint  Patient presents with   Nasal Congestion    Tony Pitts is a 27 y.o. male.  Presents to the emergency department evaluation of 1 week of sinus pain, pressure.  He describes sinus discomfort this been increased over the last couple days.  He denies any chest pain, shortness of breath abdominal pain nausea vomiting or diarrhea.  No rash.  Patient tolerating p.o. well.  Low-grade temp of 99 today.  HPI     Home Medications Prior to Admission medications   Medication Sig Start Date End Date Taking? Authorizing Provider  amoxicillin-clavulanate (AUGMENTIN) 875-125 MG tablet Take 1 tablet by mouth 2 (two) times daily for 10 days. 10/29/23 11/08/23 Yes Evon Slack, PA-C  fluticasone (FLONASE) 50 MCG/ACT nasal spray Place 1 spray into both nostrils daily. 10/29/23 10/28/24 Yes Evon Slack, PA-C  loratadine-pseudoephedrine (CLARITIN-D 12 HOUR) 5-120 MG tablet Take 1 tablet by mouth 2 (two) times daily. 10/29/23  Yes Evon Slack, PA-C      Allergies    Patient has no known allergies.    Review of Systems   Review of Systems  Physical Exam Updated Vital Signs BP 119/66   Pulse 69   Temp 99 F (37.2 C)   Resp 20   Ht 6\' 2"  (1.88 m)   Wt 98 kg   SpO2 99%   BMI 27.73 kg/m  Physical Exam Constitutional:      Appearance: He is well-developed.  HENT:     Head: Normocephalic and atraumatic.     Comments: Positive frontal and maxillary sinus tenderness.    Right Ear: External ear normal.     Left Ear: External ear normal.     Mouth/Throat:     Mouth: Mucous membranes are moist.     Pharynx: No oropharyngeal exudate or posterior oropharyngeal erythema.  Eyes:     Extraocular Movements: Extraocular movements intact.     Conjunctiva/sclera: Conjunctivae normal.     Pupils: Pupils are equal, round, and  reactive to light.  Cardiovascular:     Rate and Rhythm: Normal rate.  Pulmonary:     Effort: Pulmonary effort is normal. No respiratory distress.     Breath sounds: No wheezing.  Abdominal:     General: Abdomen is flat.     Tenderness: There is no abdominal tenderness. There is no right CVA tenderness or guarding.  Musculoskeletal:        General: Normal range of motion.     Cervical back: Normal range of motion.  Skin:    General: Skin is warm.     Findings: No rash.  Neurological:     General: No focal deficit present.     Mental Status: He is alert and oriented to person, place, and time.  Psychiatric:        Mood and Affect: Mood normal.        Behavior: Behavior normal.        Thought Content: Thought content normal.     ED Results / Procedures / Treatments   Labs (all labs ordered are listed, but only abnormal results are displayed) Labs Reviewed  SARS CORONAVIRUS 2 BY RT PCR    EKG None  Radiology No results found.  Procedures Procedures    Medications Ordered in ED Medications - No data to display  ED Course/  Medical Decision Making/ A&P                                 Medical Decision Making Risk OTC drugs. Prescription drug management.   27 year old male with sinusitis.  Positive maxillary sinus pain pressure with low-grade temp.  No chest pain, shortness of breath.  Vital signs stable, low-grade temp of 99.  Normal O2 sats and heart rate.  Lungs are clear to auscultation bilaterally with no wheezing rales or rhonchi.  History and exam consistent with sinus infection.  Will treat with Augmentin, Claritin-D and Flonase.  He is given strict return precautions understand signs symptoms return to the ER for. Final Clinical Impression(s) / ED Diagnoses Final diagnoses:  Nasal congestion  Sinus pressure    Rx / DC Orders ED Discharge Orders          Ordered    amoxicillin-clavulanate (AUGMENTIN) 875-125 MG tablet  2 times daily        10/29/23  1959    loratadine-pseudoephedrine (CLARITIN-D 12 HOUR) 5-120 MG tablet  2 times daily        10/29/23 1959    fluticasone (FLONASE) 50 MCG/ACT nasal spray  Daily        10/29/23 1959              Ronnette Juniper 10/29/23 Jearld Fenton, MD 10/29/23 2309

## 2023-10-29 NOTE — ED Triage Notes (Signed)
Pt to ED for congestion x2 days. Denies known sick contacts.

## 2023-10-29 NOTE — Discharge Instructions (Addendum)
Please take medications as prescribed.  Return to the ER for any fevers, shortness of breath, increasing sinus pain, pressure, worsening symptoms or any urgent changes in your health.

## 2023-10-29 NOTE — ED Notes (Signed)
Pt verbalizes understanding of discharge instructions. Opportunity for questioning and answers were provided. Pt discharged from ED to home.   ? ?

## 2023-12-03 ENCOUNTER — Other Ambulatory Visit: Payer: Self-pay

## 2023-12-03 DIAGNOSIS — Z720 Tobacco use: Secondary | ICD-10-CM | POA: Diagnosis not present

## 2023-12-03 DIAGNOSIS — N189 Chronic kidney disease, unspecified: Secondary | ICD-10-CM | POA: Insufficient documentation

## 2023-12-03 DIAGNOSIS — N50812 Left testicular pain: Secondary | ICD-10-CM | POA: Diagnosis not present

## 2023-12-03 DIAGNOSIS — N50819 Testicular pain, unspecified: Secondary | ICD-10-CM | POA: Diagnosis not present

## 2023-12-03 DIAGNOSIS — N452 Orchitis: Secondary | ICD-10-CM | POA: Diagnosis not present

## 2023-12-03 NOTE — ED Triage Notes (Signed)
Pt presents to ER with c/o lower abd/pubic area and testicular pain that has been going on for 3-4 days.  Pt states he has had recent unprotected sexual intercourse with a new partner.  Denies any penile discharge.  States he does have some slight burning with urination, but that it has been intermittent.  Pt is otherwise A&O x4 and in NAD at this time.

## 2023-12-04 ENCOUNTER — Emergency Department: Payer: BC Managed Care – PPO

## 2023-12-04 ENCOUNTER — Emergency Department
Admission: EM | Admit: 2023-12-04 | Discharge: 2023-12-04 | Disposition: A | Discharge status: Home / Self Care | Payer: BC Managed Care – PPO | Attending: Emergency Medicine | Admitting: Emergency Medicine

## 2023-12-04 DIAGNOSIS — N50819 Testicular pain, unspecified: Secondary | ICD-10-CM | POA: Diagnosis not present

## 2023-12-04 DIAGNOSIS — N50812 Left testicular pain: Secondary | ICD-10-CM

## 2023-12-04 LAB — URINALYSIS, ROUTINE W REFLEX MICROSCOPIC
Bilirubin Urine: NEGATIVE
Glucose, UA: NEGATIVE mg/dL
Hgb urine dipstick: NEGATIVE
Ketones, ur: 5 mg/dL — AB
Leukocytes,Ua: NEGATIVE
Nitrite: NEGATIVE
Protein, ur: NEGATIVE mg/dL
Specific Gravity, Urine: 1.029 (ref 1.005–1.030)
pH: 5 (ref 5.0–8.0)

## 2023-12-04 LAB — CHLAMYDIA/NGC RT PCR (ARMC ONLY)
Chlamydia Tr: NOT DETECTED
N gonorrhoeae: NOT DETECTED

## 2023-12-04 MED ORDER — IBUPROFEN 800 MG PO TABS
800.0000 mg | ORAL_TABLET | Freq: Once | ORAL | Status: AC
  Administered 2023-12-04: 800 mg via ORAL
  Filled 2023-12-04: qty 1

## 2023-12-04 MED ORDER — HYDROCODONE-ACETAMINOPHEN 5-325 MG PO TABS
2.0000 | ORAL_TABLET | Freq: Three times a day (TID) | ORAL | 0 refills | Status: DC | PRN
Start: 2023-12-04 — End: 2024-08-06

## 2023-12-04 MED ORDER — IBUPROFEN 800 MG PO TABS: 800.0000 mg | ORAL_TABLET | Freq: Three times a day (TID) | ORAL | 0 refills | Status: DC | PRN

## 2023-12-04 MED ORDER — CEFTRIAXONE SODIUM 500 MG IJ SOLR
500.0000 mg | Freq: Once | INTRAMUSCULAR | Status: AC
  Administered 2023-12-04: 500 mg via INTRAMUSCULAR
  Filled 2023-12-04: qty 500

## 2023-12-04 MED ORDER — LEVOFLOXACIN 500 MG PO TABS
500.0000 mg | ORAL_TABLET | Freq: Once | ORAL | Status: AC
  Administered 2023-12-04: 500 mg via ORAL
  Filled 2023-12-04: qty 1

## 2023-12-04 MED ORDER — LEVOFLOXACIN 500 MG PO TABS: 500.0000 mg | ORAL_TABLET | Freq: Every day | ORAL | 0 refills | Status: AC

## 2023-12-04 NOTE — ED Notes (Signed)
Waiting on rocephin from pharmacy

## 2023-12-04 NOTE — Discharge Instructions (Addendum)

## 2023-12-04 NOTE — ED Provider Notes (Signed)
[facility] Provider Note    Event Date/Time   First MD Initiated Contact with Patient 12/04/23 0321     (approximate)   History   Testicle Pain   HPI  Tony Pitts is a 27 y.o. male with history of kidney stone who presents to the emergency department with several days of left testicular pain.  No dysuria, hematuria, discharge.  No scrotal swelling.  No fevers, vomiting or diarrhea.  Has been sexually active with a new partner.   History provided by patient.    Past Medical History:  Diagnosis Date   Acute viral pericarditis    Chest pain    Chronic kidney disease 02/25/2020   Epigastric pain 10/30/2018   Formatting of this note might be different from the original.  Last Assessment & Plan:   Suspect gastritis with recent significant alcohol use.  Patient aware that he should use alcohol in moderation.  Avoid excessive use.  He does not drink on a regular basis.  We will try pantoprazole 40 mg 30 minutes before breakfast for the next 30 days.  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  Last Assessment & Plan:   Suspected hemangioma.  MRI is recommended by radiology.  Provided reassurance to patient and his mother today.     Tobacco user 12/28/2016    Past Surgical History:  Procedure Laterality Date   ANTERIOR CRUCIATE LIGAMENT REPAIR Right     MEDICATIONS:  Prior to Admission medications   Medication Sig Start Date End Date Taking? Authorizing Provider  fluticasone (FLONASE) 50 MCG/ACT nasal spray Place 1 spray into both nostrils daily. 10/29/23 10/28/24  Evon Slack, PA-C  loratadine-pseudoephedrine (CLARITIN-D 12 HOUR) 5-120 MG tablet Take 1 tablet by mouth 2 (two) times daily. 10/29/23   Evon Slack, PA-C    Physical Exam   Triage Vital Signs: ED Triage Vitals  Encounter Vitals Group     BP 12/03/23  2336 120/74     Systolic BP Percentile --      Diastolic BP Percentile --      Pulse Rate 12/03/23 2336 74     Resp 12/03/23 2336 15     Temp 12/03/23 2336 98.2 F (36.8 C)     Temp Source 12/03/23 2336 Oral     SpO2 12/03/23 2336 96 %     Weight 12/03/23 2337 212 lb (96.2 kg)     Height 12/03/23 2337 6\' 2"  (1.88 m)     Head Circumference --      Peak Flow --      Pain Score 12/03/23 2337 4     Pain Loc --      Pain Education --      Exclude from Growth Chart --     Most recent vital signs: Vitals:   12/03/23 2336  BP: 120/74  Pulse: 74  Resp: 15  Temp: 98.2 F (36.8 C)  SpO2: 96%    CONSTITUTIONAL: Alert, responds appropriately to questions. Well-appearing; well-nourished HEAD: Normocephalic, atraumatic EYES: Conjunctivae clear, pupils appear equal, sclera nonicteric ENT: normal nose; moist mucous membranes NECK: Supple, normal ROM CARD: RRR; S1 and S2 appreciated RESP: Normal chest excursion without splinting or tachypnea; breath sounds clear and equal bilaterally; no wheezes, no rhonchi, no rales, no hypoxia or respiratory distress, speaking full sentences ABD/GI: Non-distended; soft,  non-tender, no rebound, no guarding, no peritoneal signs GU:  Normal external genitalia, circumcised male, normal penile shaft, no blood or discharge at the urethral meatus, no testicular masses or tenderness on exam, no scrotal masses or swelling, no hernias appreciated, 2+ femoral pulses bilaterally; no perineal erythema, warmth, subcutaneous air or crepitus; no high riding testicle. Chaperone present for exam. BACK: The back appears normal EXT: Normal ROM in all joints; no deformity noted, no edema SKIN: Normal color for age and race; warm; no rash on exposed skin NEURO: Moves all extremities equally, normal speech PSYCH: The patient's mood and manner are appropriate.   ED Results / Procedures / Treatments   LABS: (all labs ordered are listed, but only abnormal results are  displayed) Labs Reviewed  URINALYSIS, ROUTINE W REFLEX MICROSCOPIC - Abnormal; Notable for the following components:      Result Value   Color, Urine YELLOW (*)    APPearance CLEAR (*)    Ketones, ur 5 (*)    All other components within normal limits  CHLAMYDIA/NGC RT PCR ([facility] ONLY)               EKG:  RADIOLOGY: My personal review and interpretation of imaging: Scrotal ultrasound unremarkable.  I have personally reviewed all radiology reports.   US SCROTUM W/DOPPLER  Result Date: 12/04/2023 CLINICAL DATA:  Testicle pain. EXAM: SCROTAL ULTRASOUND DOPPLER ULTRASOUND OF THE TESTICLES TECHNIQUE: Complete ultrasound examination of the testicles, epididymis, and other scrotal structures was performed. Color and spectral Doppler ultrasound were also utilized to evaluate blood flow to the testicles. COMPARISON:  None Available. FINDINGS: Right testicle Measurements: 4.1 cm x 2.0 cm x 2.6 cm. No mass or microlithiasis visualized. Left testicle Measurements: 4.0 cm x 1.9 cm x 2.5 cm. A 0.1 cm x 0.2 cm x 0.2 cm hyperechoic focus is seen within the parenchyma of the left testicle. No abnormal flow is seen within this region on color Doppler evaluation. Right epididymis:  Normal in size and appearance. Left epididymis:  Normal in size and appearance. Hydrocele:  None visualized. Varicocele:  None visualized. Pulsed Doppler interrogation of both testes demonstrates normal low resistance arterial and venous waveforms bilaterally. IMPRESSION: 1. Hyperechoic focus within the left testicle which may represent a small parenchymal calcification. 2. Normal bilateral testicular flow. Electronically Signed   By: Aram Candela M.D.   On: 12/04/2023 02:22     PROCEDURES:  Critical Care performed: No      Procedures    IMPRESSION / MDM / ASSESSMENT AND PLAN / ED COURSE  I reviewed the triage vital signs and the nursing notes.    Patient here with left testicular pain.     DIFFERENTIAL  DIAGNOSIS (includes but not limited to):   Orchitis, epididymitis, torsion, UTI, STD, kidney stone, no sign of abscess, hernia, cellulitis, Fournier's gangrene   Patient's presentation is most consistent with acute presentation with potential threat to life or bodily function.   PLAN: Workup initiated from triage.  Urine gonorrhea and Chlamydia tests are negative.  Urinalysis shows no infection or blood.  Scrotal ultrasound reviewed and interpreted by myself and the radiologist and is unremarkable with good blood flow to both testicles.  Clinically I suspect that he has orchitis or epididymitis.  Will give Rocephin here and start on Levaquin for 10 days.  Will discharge with short course of pain medication.  Given urology follow-up if symptoms not improving.   MEDICATIONS GIVEN IN ED: Medications  cefTRIAXone (ROCEPHIN) injection 500 mg (has no  administration in time range)  ibuprofen (ADVIL) tablet 800 mg (has no administration in time range)  levofloxacin (LEVAQUIN) tablet 500 mg (has no administration in time range)     ED COURSE:  At this time, I do not feel there is any life-threatening condition present. I reviewed all nursing notes, vitals, pertinent previous records.  All lab and urine results, EKGs, imaging ordered have been independently reviewed and interpreted by myself.  I reviewed all available radiology reports from any imaging ordered this visit.  Based on my assessment, I feel the patient is safe to be discharged home without further emergent workup and can continue workup as an outpatient as needed. Discussed all findings, treatment plan as well as usual and customary return precautions.  They verbalize understanding and are comfortable with this plan.  Outpatient follow-up has been provided as needed.  All questions have been answered.    CONSULTS:  none   OUTSIDE RECORDS REVIEWED: Reviewed last GI notes in 2019.       FINAL CLINICAL IMPRESSION(S) / ED DIAGNOSES    Final diagnoses:  Pain in left testicle     Rx / DC Orders   ED Discharge Orders          Ordered    levofloxacin (LEVAQUIN) 500 MG tablet  Daily        12/04/23 0346    HYDROcodone-acetaminophen (NORCO/VICODIN) 5-325 MG tablet  Every 8 hours PRN        12/04/23 0346    ibuprofen (ADVIL) 800 MG tablet  Every 8 hours PRN        12/04/23 0346             Note:  This document was prepared using Dragon voice recognition software and may include unintentional dictation errors.   Pragya Lofaso, Layla Maw, DO 12/04/23 [phone]

## 2023-12-04 NOTE — ED Notes (Signed)
Assisted md with exam of genitals

## 2023-12-22 ENCOUNTER — Ambulatory Visit
Admission: RE | Admit: 2023-12-22 | Discharge: 2023-12-22 | Disposition: A | Discharge status: Home / Self Care | Payer: BC Managed Care – PPO | Source: Ambulatory Visit | Attending: Emergency Medicine | Admitting: Emergency Medicine

## 2023-12-22 VITALS — BP 147/73 | HR 63 | Temp 99.7°F | Resp 18

## 2023-12-22 DIAGNOSIS — R369 Urethral discharge, unspecified: Secondary | ICD-10-CM | POA: Diagnosis not present

## 2023-12-22 DIAGNOSIS — R300 Dysuria: Secondary | ICD-10-CM | POA: Insufficient documentation

## 2023-12-22 DIAGNOSIS — N50812 Left testicular pain: Secondary | ICD-10-CM | POA: Insufficient documentation

## 2023-12-22 LAB — POCT URINALYSIS DIP (MANUAL ENTRY)
Blood, UA: NEGATIVE
Glucose, UA: NEGATIVE mg/dL
Leukocytes, UA: NEGATIVE
Nitrite, UA: NEGATIVE
Spec Grav, UA: >=1.03 — AB
Urobilinogen, UA: 0.2 U/dL
pH, UA: 5.5

## 2023-12-22 NOTE — ED Provider Notes (Signed)
Tony Pitts    CSN: [id number] Arrival date & time: 12/22/23  4098      History   Chief Complaint Chief Complaint  Patient presents with   Follow-up    Was seen at er for testicular pain, tested for std results were negative. I took medicine but symptoms not going away - Entered by patient   Penile Discharge    HPI Tony Pitts is a 27 y.o. male.   Next patient presents for evaluation of persisting dysuria and left testicle pain present for 2 to 3 weeks, noticed clear penile discharge 1 day ago as well as lower abdominal pressure.  Sexually active but no known exposure.  Denies hematuria, flank pain, fever.  Was evaluated in the emergency department 2 weeks ago, completed antibiotic course, did see some improvement but symptoms did not fully resolve.  Past Medical History:  Diagnosis Date   Acute viral pericarditis    Chest pain    Chronic kidney disease 02/25/2020   Epigastric pain 10/30/2018   Formatting of this note might be different from the original.  Last Assessment & Plan:   Suspect gastritis with recent significant alcohol use.  Patient aware that he should use alcohol in moderation.  Avoid excessive use.  He does not drink on a regular basis.  We will try pantoprazole 40 mg 30 minutes before breakfast for the next 30 days.  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  Last Assessment & Plan:   Suspected hemangioma.  MRI is recommended by radiology.  Provided reassurance to patient and his mother today.     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 Authorizing Provider  HYDROcodone-acetaminophen (NORCO/VICODIN) 5-325 MG tablet Take 2 tablets by mouth every 8 (eight) hours as needed. 12/04/23  Yes Ward, Layla Maw, DO  levofloxacin (LEVAQUIN) 500 MG tablet Take 500 mg by mouth daily.   Yes [provider]  fluticasone (FLONASE) 50 MCG/ACT nasal spray Place 1 spray into both nostrils daily. 10/29/23 10/28/24  Evon Slack, PA-C  ibuprofen (ADVIL) 800 MG tablet Take 1 tablet (800 mg total) by mouth every 8 (eight) hours as needed. 12/04/23   Ward, Layla Maw, DO  loratadine-pseudoephedrine (CLARITIN-D 12 HOUR) 5-120 MG tablet Take 1 tablet by mouth 2 (two) times daily. 10/29/23   Evon Slack, PA-C    Family History Family History  Problem Relation Age of Onset   Cancer Mother        breast   Asthma Sister    Lung cancer Maternal Grandfather    Colon cancer Other    Throat cancer Other    Cirrhosis Other        alcoholic    Social History Social History   Tobacco Use  Smoking status: Former    Current packs/day: 0.25    Average packs/day: 0.3 packs/day for 10.0 years (2.5 ttl pk-yrs)    Types: Cigarettes   Smokeless tobacco: Never   Tobacco comments:    1-2 black and milds daily  Vaping Use   Vaping status: Every Day   Substances: Nicotine  Substance Use Topics   Alcohol use: Not Currently    Comment: once every 3-4 months   Drug use: Yes    Frequency: 7.0 times per week    Types: Marijuana     Allergies   Patient has no known allergies.   Review of Systems Review of Systems   Physical Exam Triage Vital Signs ED Triage Vitals [12/22/23 1014]  Encounter Vitals Group     BP (!) 147/73     Systolic BP Percentile      Diastolic BP Percentile      Pulse Rate 63     Resp  18     Temp 99.7 F (37.6 C)     Temp Source Oral     SpO2 98 %     Weight      Height      Head Circumference      Peak Flow      Pain Score 0     Pain Loc      Pain Education      Exclude from Growth Chart    No data found.  Updated Vital Signs BP (!) 147/73 (BP Location: Left Arm)   Pulse 63   Temp 99.7 F (37.6 C) (Oral)   Resp 18   SpO2 98%   Visual Acuity Right Eye Distance:   Left Eye Distance:   Bilateral Distance:    Right Eye Near:   Left Eye Near:    Bilateral Near:     Physical Exam Constitutional:      Appearance: Normal appearance.  Eyes:     Extraocular Movements: Extraocular movements intact.  Pulmonary:     Effort: Pulmonary effort is normal.  Genitourinary:    Comments: deferred Neurological:     Mental Status: He is alert and oriented to person, place, and time. Mental status is at baseline.      UC Treatments / Results  Labs (all labs ordered are listed, but only abnormal results are displayed) Labs Reviewed  URINE CULTURE  RPR  HIV ANTIBODY (ROUTINE TESTING W REFLEX)  POCT URINALYSIS DIP (MANUAL ENTRY)  CYTOLOGY, (ORAL, ANAL, URETHRAL) ANCILLARY ONLY    EKG   Radiology No results found.  Procedures Procedures (including critical care time)  Medications Ordered in UC Medications - No data to display  Initial Impression / Assessment and Plan / UC Course  I have reviewed the triage vital signs and the nursing notes.  Pertinent labs & imaging results that were available during my care of the patient were reviewed by me and considered in my medical decision making (see chart for details).  Pain in left testicle  Vitals are stable, patient in no signs of distress nontoxic-appearing, urinalysis negative, sent for culture, STD labs pending, will treat per protocol, will defer additional antibiotic usage as unknown etiology, stable for outpatient management, Doppler completed during ED visit, negative, reviewed with patient,  advised urology follow-up if symptoms continue to persist Final Clinical Impressions(s) / UC Diagnoses   Final diagnoses:  Pain in left testicle     Discharge Instructions      Your evaluated for your testicular pain  Urinalysis  today is negative, he has been sent to the lab to determine if bacteria will grow, typically takes 3 days, if this occurs you will be notified and antibiotic sent to pharmacy  Doppler ultrasound of your testicles in the emergency department was normal  Urinalysis was negative for bladder infection  Labs pending 2-3 days, you will be contacted if positive for any sti and treatment will be sent to the pharmacy, you will have to return to the clinic if positive for gonorrhea to receive treatment   Please refrain from having sex until labs results, if positive please refrain from having sex until treatment complete and symptoms resolve   If positive for HIV, Syphilis, Chlamydia  gonorrhea or trichomoniasis please notify partner or partners so they may tested as well   it is recommended you use some form of protection against the transmission of sti infections  such as condoms or dental dams with each sexual encounter    If you continue to have symptoms please follow-up with neurologist, information listed on front page   ED Prescriptions   None    PDMP not reviewed this encounter.   Valinda Hoar, NP 12/22/23 1039

## 2023-12-22 NOTE — Discharge Instructions (Addendum)
Your evaluated for your testicular pain  Urinalysis today is negative, he has been sent to the lab to determine if bacteria will grow, typically takes 3 days, if this occurs you will be notified and antibiotic sent to pharmacy  Doppler ultrasound of your testicles in the emergency department was normal  Urinalysis was negative for bladder infection  Labs pending 2-3 days, you will be contacted if positive for any sti and treatment will be sent to the pharmacy, you will have to return to the clinic if positive for gonorrhea to receive treatment   Please refrain from having sex until labs results, if positive please refrain from having sex until treatment complete and symptoms resolve   If positive for HIV, Syphilis, Chlamydia  gonorrhea or trichomoniasis please notify partner or partners so they may tested as well   it is recommended you use some form of protection against the transmission of sti infections  such as condoms or dental dams with each sexual encounter    If you continue to have symptoms please follow-up with neurologist, information listed on front page

## 2023-12-22 NOTE — ED Triage Notes (Signed)
Pt states he is having discomfort to testicles with swelling, now having pain to tip of penis, burning with urination that comes and goes, with clear penile discharge that started yesterday.  Pt went to the ER 12/3 and was tested for STDs that were negative.   Pt would like to be re-tested for STDs and have blood work done.

## 2023-12-23 LAB — URINE CULTURE: Culture: NO GROWTH

## 2023-12-24 LAB — CYTOLOGY, (ORAL, ANAL, URETHRAL) ANCILLARY ONLY
Chlamydia: NEGATIVE
Comment: NEGATIVE
Comment: NEGATIVE
Comment: NORMAL
Neisseria Gonorrhea: NEGATIVE
Trichomonas: NEGATIVE

## 2023-12-24 LAB — HIV ANTIBODY (ROUTINE TESTING W REFLEX): HIV Screen 4th Generation wRfx: NONREACTIVE

## 2023-12-24 LAB — RPR: RPR Ser Ql: NONREACTIVE

## 2023-12-26 DIAGNOSIS — N50812 Left testicular pain: Secondary | ICD-10-CM | POA: Diagnosis not present

## 2024-01-01 DIAGNOSIS — Z113 Encounter for screening for infections with a predominantly sexual mode of transmission: Secondary | ICD-10-CM | POA: Diagnosis not present

## 2024-01-01 DIAGNOSIS — N342 Other urethritis: Secondary | ICD-10-CM | POA: Diagnosis not present

## 2024-01-15 ENCOUNTER — Ambulatory Visit: Payer: BC Managed Care – PPO | Admitting: Urology

## 2024-01-15 NOTE — Progress Notes (Deleted)
Assessment: 1. Pain in left testicle      Plan: I personally reviewed the patient's chart including provider notes, lab and imaging results.   Chief Complaint: No chief complaint on file.   History of Present Illness:  Tony Pitts is a 28 y.o. male who is seen for evaluation of testicular pain. He presented to the emergency room on 12/04/2023 with flank pain and left testicular pain. Scrotal ultrasound showed a 1 x 2 x 2 mm hyperechoic focus in the parenchyma of the left testicle without abnormal blood flow. STD testing was negative. He was treated with antibiotics for presumed epididymitis/orchitis. He returns to the emergency room on 12/22/2023 with continued pain in the left testicle as well as a penile discharge. Urinalysis was unremarkable.  Urine culture showed no growth.   Past Medical History:  Past Medical History:  Diagnosis Date   Acute viral pericarditis    Chest pain    Chronic kidney disease 02/25/2020   Epigastric pain 10/30/2018   Formatting of this note might be different from the original.  Last Assessment & Plan:   Suspect gastritis with recent significant alcohol use.  Patient aware that he should use alcohol in moderation.  Avoid excessive use.  He does not drink on a regular basis.  We will try pantoprazole 40 mg 30 minutes before breakfast for the next 30 days.  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  Last Assessment & Plan:   Suspected hemangioma.  MRI is recommended by radiology.  Provided reassurance to patient and his mother today.     Tobacco user 12/28/2016    Past Surgical History:  Past Surgical History:  Procedure Laterality Date   ANTERIOR CRUCIATE LIGAMENT REPAIR Right     Allergies:  No Known Allergies  Family History:  Family History  Problem Relation Age of Onset   Cancer Mother        breast   Asthma  Sister    Lung cancer Maternal Grandfather    Colon cancer Other    Throat cancer Other    Cirrhosis Other        alcoholic    Social History:  Social History   Tobacco Use   Smoking status: Former    Current packs/day: 0.25    Average packs/day: 0.3 packs/day for 10.0 years (2.5 ttl pk-yrs)    Types: Cigarettes   Smokeless tobacco: Never   Tobacco comments:    1-2 black and milds daily  Vaping Use   Vaping status: Every Day   Substances: Nicotine  Substance Use Topics   Alcohol use: Not Currently    Comment: once every 3-4 months   Drug use: Yes    Frequency: 7.0 times per week    Types: Marijuana    Review of symptoms:  Constitutional:  Negative for unexplained weight loss, night sweats, fever, chills ENT:  Negative for nose bleeds, sinus pain, painful swallowing CV:  Negative for chest pain, shortness of breath, exercise intolerance, palpitations, loss of consciousness Resp:  Negative for cough, wheezing, shortness of breath GI:  Negative for nausea, vomiting, diarrhea, bloody stools GU:  Positives noted in HPI; otherwise negative for gross hematuria, dysuria, urinary incontinence Neuro:  Negative for seizures, poor balance, limb weakness, slurred speech Psych:  Negative for lack of energy, depression, anxiety Endocrine:  Negative for polydipsia,  polyuria, symptoms of hypoglycemia (dizziness, hunger, sweating) Hematologic:  Negative for anemia, purpura, petechia, prolonged or excessive bleeding, use of anticoagulants  Allergic:  Negative for difficulty breathing or choking as a result of exposure to anything; no shellfish allergy; no allergic response (rash/itch) to materials, foods  Physical exam: There were no vitals taken for this visit. GENERAL APPEARANCE:  Well appearing, well developed, well nourished, NAD HEENT: Atraumatic, Normocephalic, oropharynx clear. NECK: Supple without lymphadenopathy or thyromegaly. LUNGS: Clear to auscultation bilaterally. HEART:  Regular Rate and Rhythm without murmurs, gallops, or rubs. ABDOMEN: Soft, non-tender, No Masses. EXTREMITIES: Moves all extremities well.  Without clubbing, cyanosis, or edema. NEUROLOGIC:  Alert and oriented x 3, normal gait, CN II-XII grossly intact.  MENTAL STATUS:  Appropriate. BACK:  Non-tender to palpation.  No CVAT SKIN:  Warm, dry and intact.  GU: Penis:  {Exam; penis:5791} Meatus: {Meatus:15530} Scrotum: {pe scrotum:310183} Testis: {Exam; testicles:5790} Epididymis: {epididymis ZOXW:960454} Prostate: {Exam; prostate:5793} Rectum: {rectal exam:26517}    Results: U/A:

## 2024-01-24 DIAGNOSIS — M545 Low back pain, unspecified: Secondary | ICD-10-CM | POA: Diagnosis not present

## 2024-01-24 DIAGNOSIS — R369 Urethral discharge, unspecified: Secondary | ICD-10-CM | POA: Diagnosis not present

## 2024-01-24 DIAGNOSIS — R21 Rash and other nonspecific skin eruption: Secondary | ICD-10-CM | POA: Diagnosis not present

## 2024-01-29 ENCOUNTER — Other Ambulatory Visit: Payer: Self-pay | Admitting: Urology

## 2024-01-29 DIAGNOSIS — N50812 Left testicular pain: Secondary | ICD-10-CM

## 2024-01-30 ENCOUNTER — Other Ambulatory Visit: Payer: BC Managed Care – PPO

## 2024-01-31 ENCOUNTER — Ambulatory Visit
Admission: RE | Admit: 2024-01-31 | Discharge: 2024-01-31 | Disposition: A | Discharge status: Home / Self Care | Payer: BC Managed Care – PPO | Source: Ambulatory Visit | Attending: Urology | Admitting: Urology

## 2024-01-31 DIAGNOSIS — N50812 Left testicular pain: Secondary | ICD-10-CM | POA: Diagnosis not present

## 2024-02-10 ENCOUNTER — Other Ambulatory Visit: Payer: Self-pay

## 2024-02-10 ENCOUNTER — Encounter: Payer: Self-pay | Admitting: Emergency Medicine

## 2024-02-10 DIAGNOSIS — Z020 Encounter for examination for admission to educational institution: Secondary | ICD-10-CM | POA: Diagnosis not present

## 2024-02-10 DIAGNOSIS — Z5321 Procedure and treatment not carried out due to patient leaving prior to being seen by health care provider: Secondary | ICD-10-CM | POA: Diagnosis not present

## 2024-02-10 NOTE — ED Triage Notes (Signed)
 Patient ambulatory to triage with steady gait, without difficulty or distress noted pt reports that he was sent over to get a COVID test; pt denies any c/o, denies any symptoms

## 2024-02-11 ENCOUNTER — Emergency Department
Admission: EM | Admit: 2024-02-11 | Discharge: 2024-02-11 | Discharge status: Against Medical Advice | Payer: BC Managed Care – PPO | Attending: Emergency Medicine | Admitting: Emergency Medicine

## 2024-02-11 NOTE — ED Notes (Signed)
 No answer when called several times from lobby

## 2024-02-27 DIAGNOSIS — R102 Pelvic and perineal pain: Secondary | ICD-10-CM | POA: Diagnosis not present

## 2024-03-05 DIAGNOSIS — L02416 Cutaneous abscess of left lower limb: Secondary | ICD-10-CM | POA: Diagnosis not present

## 2024-03-05 DIAGNOSIS — B372 Candidiasis of skin and nail: Secondary | ICD-10-CM | POA: Diagnosis not present

## 2024-03-19 ENCOUNTER — Ambulatory Visit: Admitting: Family Medicine

## 2024-03-23 ENCOUNTER — Ambulatory Visit: Admitting: Family Medicine

## 2024-08-06 ENCOUNTER — Emergency Department
Admission: EM | Admit: 2024-08-06 | Discharge: 2024-08-06 | Disposition: A | Discharge status: Home / Self Care | Payer: Self-pay

## 2024-08-06 ENCOUNTER — Other Ambulatory Visit: Payer: Self-pay

## 2024-08-06 DIAGNOSIS — R197 Diarrhea, unspecified: Secondary | ICD-10-CM | POA: Insufficient documentation

## 2024-08-06 DIAGNOSIS — F17200 Nicotine dependence, unspecified, uncomplicated: Secondary | ICD-10-CM | POA: Insufficient documentation

## 2024-08-06 DIAGNOSIS — R112 Nausea with vomiting, unspecified: Secondary | ICD-10-CM | POA: Insufficient documentation

## 2024-08-06 DIAGNOSIS — R109 Unspecified abdominal pain: Secondary | ICD-10-CM | POA: Insufficient documentation

## 2024-08-06 DIAGNOSIS — N189 Chronic kidney disease, unspecified: Secondary | ICD-10-CM | POA: Insufficient documentation

## 2024-08-06 LAB — URINALYSIS, ROUTINE W REFLEX MICROSCOPIC
Bilirubin Urine: NEGATIVE
Glucose, UA: NEGATIVE mg/dL
Hgb urine dipstick: NEGATIVE
Ketones, ur: NEGATIVE mg/dL
Leukocytes,Ua: NEGATIVE
Nitrite: NEGATIVE
Protein, ur: NEGATIVE mg/dL
Specific Gravity, Urine: 1.019 (ref 1.005–1.030)
pH: 7 (ref 5.0–8.0)

## 2024-08-06 LAB — COMPREHENSIVE METABOLIC PANEL WITH GFR
ALT: 24 U/L (ref 0–44)
AST: 28 U/L (ref 15–41)
Albumin: 3.5 g/dL (ref 3.5–5.0)
Alkaline Phosphatase: 34 U/L — ABNORMAL LOW (ref 38–126)
Anion gap: 9 (ref 5–15)
BUN: 18 mg/dL (ref 6–20)
CO2: 27 mmol/L (ref 22–32)
Calcium: 9.1 mg/dL (ref 8.9–10.3)
Chloride: 106 mmol/L (ref 98–111)
Creatinine, Ser: 1.32 mg/dL — ABNORMAL HIGH (ref 0.61–1.24)
GFR, Estimated: >60 mL/min (ref >60)
Glucose, Bld: 95 mg/dL (ref 70–99)
Potassium: 5.1 mmol/L (ref 3.5–5.1)
Sodium: 142 mmol/L (ref 135–145)
Total Bilirubin: 0.7 mg/dL (ref 0.0–1.2)
Total Protein: 5.9 g/dL — ABNORMAL LOW (ref 6.5–8.1)

## 2024-08-06 LAB — CBC
HCT: 45.4 % (ref 39.0–52.0)
Hemoglobin: 15 g/dL (ref 13.0–17.0)
MCH: 31.5 pg (ref 26.0–34.0)
MCHC: 33 g/dL (ref 30.0–36.0)
MCV: 95.4 fL (ref 80.0–100.0)
Platelets: 275 K/uL (ref 150–400)
RBC: 4.76 MIL/uL (ref 4.22–5.81)
RDW: 13.4 % (ref 11.5–15.5)
WBC: 4.8 K/uL (ref 4.0–10.5)
nRBC: 0 % (ref 0.0–0.2)

## 2024-08-06 LAB — LIPASE, BLOOD: Lipase: 36 U/L (ref 11–51)

## 2024-08-06 MED ORDER — SODIUM CHLORIDE 0.9 % IV BOLUS
1000.0000 mL | Freq: Once | INTRAVENOUS | Status: AC
  Administered 2024-08-06: 1000 mL via INTRAVENOUS

## 2024-08-06 MED ORDER — ONDANSETRON HCL 4 MG/2ML IJ SOLN
4.0000 mg | Freq: Once | INTRAMUSCULAR | Status: AC
  Administered 2024-08-06: 4 mg via INTRAVENOUS
  Filled 2024-08-06: qty 2

## 2024-08-06 NOTE — ED Notes (Signed)
 See triage note. States he developed some abd discomfort with n/v/d on Wednesday  Last time vomiting was last pm  Diarrhea this am  Afebrile on arrival

## 2024-08-06 NOTE — Discharge Instructions (Signed)
You were seen in the Emergency Department for your abdominal pain. Your vital signs and physical exam were normal, you were observed and there is no evidence of any serious medical problems. We checked lab work including your blood counts and general chemistries and these were all normal today without any remarkable abnormalities.  We provided you with IV fluids and medications to help treat your symptoms.  It is most likely that your symptoms today are caused by a viral infection and should improve over the next few days.  It is important that you maintain a gentle diet with plenty of clear fluids.  You can use the medication we provided you for symptomatic relief of the nausea.  You should stay well hydrated. -- Rest, Drink lots of fluids. Advance diet as tolerated, if your have worsening abdominal pain, if you are unable to keep fluids down, should you develop fever greater than 100.4, return to the Emergency Department. If you have any concerns at all, please return to the Emergency Department. -- For the next 48 hours maintain hydration with at least 8 glasses of fluid per day of clear juices- apple or cranberry, broth, jell-O, popsicles, sherbert, tea, ginger ale and dry toast and crackers. -- After 48 hours Progress your diet to soft BRAT Diet: rice, bananas, applesauce and toast: then mashed potatoes and vegetables, -- Advance as tolerated, vegetables, eggs, chicken, fish, fruit -- Refrain from dairy products, fatty greasy, spicy food for one week If you have increasing abdominal pain, bright red blood from rectum, uncontrollable nausea and vomiting, fever > 100.4F, chills, inability to hold down liquids, decrease in urination, or any symptoms   

## 2024-08-06 NOTE — ED Provider Notes (Signed)
 [facility] Provider Note    None    (approximate)   History   Emesis   HPI  Tony Pitts is a 28 y.o. male who presents today for evaluation of nausea, vomiting, diarrhea.  Patient reports that he ate at a [employer] 2 days ago and awoke yesterday with nausea, vomiting, and intermittent abdominal pain.  Patient describes his abdominal pain as cramping which improves after he moves his bowels.  No blood in his stool.  Patient reports that his vomiting resolved yesterday, no vomiting today.  Patient Active Problem List   Diagnosis Date Noted   Kidney stone    Chronic kidney disease 02/25/2020   Liver mass 10/30/2018   Tobacco user 12/28/2016          Physical Exam   Triage Vital Signs: ED Triage Vitals  Encounter Vitals Group     BP 08/06/24 0830 (!) 118/107     Girls Systolic BP Percentile --      Girls Diastolic BP Percentile --      Boys Systolic BP Percentile --      Boys Diastolic BP Percentile --      Pulse Rate 08/06/24 0830 98     Resp 08/06/24 0830 18     Temp 08/06/24 0830 98.5 F (36.9 C)     Temp Source 08/06/24 0830 Oral     SpO2 08/06/24 0830 100 %     Weight 08/06/24 0827 205 lb (93 kg)     Height 08/06/24 0827 6' 2 (1.88 m)     Head Circumference --      Peak Flow --      Pain Score 08/06/24 0830 1     Pain Loc --      Pain Education --      Exclude from Growth Chart --     Most recent vital signs: Vitals:   08/06/24 0830  BP: (!) 118/107  Pulse: 98  Resp: 18  Temp: 98.5 F (36.9 C)  SpO2: 100%    Physical Exam Vitals and nursing note reviewed.  Constitutional:      General: Awake and alert. No acute distress.    Appearance: Normal appearance. The patient is normal weight.  HENT:     Head: Normocephalic and atraumatic.     Mouth: Mucous membranes are moist.  Eyes:     General: PERRL. Normal EOMs        Right eye: No discharge.        Left eye: No discharge.     Conjunctiva/sclera:  Conjunctivae normal.  Cardiovascular:     Rate and Rhythm: Normal rate and regular rhythm.     Pulses: Normal pulses.  Pulmonary:     Effort: Pulmonary effort is normal. No respiratory distress.     Breath sounds: Normal breath sounds.  Abdominal:     Abdomen is soft. There is no abdominal tenderness. No rebound or guarding. No distention. Musculoskeletal:        General: No swelling. Normal range of motion.     Cervical back: Normal range of motion and neck supple.  Skin:    General: Skin is warm and dry.     Capillary Refill: Capillary refill takes less than 2 seconds.     Findings: No rash.  Neurological:     Mental Status: The patient is awake and alert.      ED Results / Procedures / Treatments   Labs (all labs ordered are listed,  but only abnormal results are displayed) Labs Reviewed  COMPREHENSIVE METABOLIC PANEL WITH GFR - Abnormal; Notable for the following components:      Result Value   Creatinine, Ser 1.32 (*)    Total Protein 5.9 (*)    Alkaline Phosphatase 34 (*)    All other components within normal limits  URINALYSIS, ROUTINE W REFLEX MICROSCOPIC - Abnormal; Notable for the following components:   Color, Urine YELLOW (*)    APPearance CLEAR (*)    All other components within normal limits  LIPASE, BLOOD  CBC     EKG     RADIOLOGY     PROCEDURES:  Critical Care performed:   Procedures   MEDICATIONS ORDERED IN ED: Medications  ondansetron  (ZOFRAN ) injection 4 mg (4 mg Intravenous Given 08/06/24 0924)  sodium chloride  0.9 % bolus 1,000 mL (0 mLs Intravenous Stopped 08/06/24 1025)     IMPRESSION / MDM / ASSESSMENT AND PLAN / ED COURSE  I reviewed the triage vital signs and the nursing notes.   Differential diagnosis includes, but is not limited to, gastroenteritis, dehydration, electrolyte disarray, less likely appendicitis or diverticulitis.  I reviewed the patient's chart.  No recent emergency department visits in the past several  months.  Patient is awake and alert, hemodynamically stable and afebrile.  He is nontoxic in appearance.  He has no reproducible abdominal tenderness on exam.  Patient is overall well-appearing. Vomiting and diarrhea are non-bloody, and vomiting has resolved, no vomiting since yesterday. Patient is hemodynamically stable. No history of immunosuppression, no other red flags such as recent travel, sick contacts or recent antibiotic use. Improved with treatment in the emergency department and tolerating oral intake.  Labs overall reassuring including normal electrolytes, normal white blood cell count, normal LFTs and lipase.  Creatinine is at his baseline.  Differential diagnosis is broad however  without focal abdominal tenderness and given improvement, no concern that the patient requires urgent imaging or has surgical process in abdomen. Given that patient has a paucity of red flags for the diarrhea as it pertains to patient's past medical history and history of present illness, no indication for further observation or empiric antibiotic treatment.  He was treated symptomatically with Zofran  and IV fluids with significant improvement of his symptoms.  He was given a work note per his request.  Discussed care plan, return precautions, and advised close outpatient follow-up. Patient agrees with plan of care.   Patient's presentation is most consistent with acute complicated illness / injury requiring diagnostic workup.    FINAL CLINICAL IMPRESSION(S) / ED DIAGNOSES   Final diagnoses:  Nausea vomiting and diarrhea     Rx / DC Orders   ED Discharge Orders     None        Note:  This document was prepared using Dragon voice recognition software and may include unintentional dictation errors.   Tylynn Braniff E, PA-C 08/06/24 1415    Clarine Ozell LABOR, MD 08/09/24 2322

## 2024-08-06 NOTE — ED Triage Notes (Signed)
 Pt to ED via POV from home. Pt reports was eating at a [employer] on Tuesday and got tacos. Pt reports Wednesday woke up with N/V/D and intermittent abd pain. Pt reports some marijuana use.

## 2024-08-19 ENCOUNTER — Encounter (HOSPITAL_BASED_OUTPATIENT_CLINIC_OR_DEPARTMENT_OTHER): Payer: Self-pay

## 2024-08-19 ENCOUNTER — Emergency Department (HOSPITAL_BASED_OUTPATIENT_CLINIC_OR_DEPARTMENT_OTHER)
Admission: EM | Admit: 2024-08-19 | Discharge: 2024-08-19 | Disposition: A | Discharge status: Home / Self Care | Payer: Worker's Compensation | Attending: Emergency Medicine | Admitting: Emergency Medicine

## 2024-08-19 ENCOUNTER — Other Ambulatory Visit: Payer: Self-pay

## 2024-08-19 ENCOUNTER — Telehealth: Payer: Self-pay | Admitting: Physician Assistant

## 2024-08-19 DIAGNOSIS — S46812A Strain of other muscles, fascia and tendons at shoulder and upper arm level, left arm, initial encounter: Secondary | ICD-10-CM | POA: Diagnosis not present

## 2024-08-19 DIAGNOSIS — X500XXA Overexertion from strenuous movement or load, initial encounter: Secondary | ICD-10-CM | POA: Diagnosis not present

## 2024-08-19 DIAGNOSIS — Y990 Civilian activity done for income or pay: Secondary | ICD-10-CM | POA: Insufficient documentation

## 2024-08-19 DIAGNOSIS — M542 Cervicalgia: Secondary | ICD-10-CM

## 2024-08-19 DIAGNOSIS — S4992XA Unspecified injury of left shoulder and upper arm, initial encounter: Secondary | ICD-10-CM | POA: Diagnosis present

## 2024-08-19 MED ORDER — METHOCARBAMOL 500 MG PO TABS: 500.0000 mg | ORAL_TABLET | Freq: Two times a day (BID) | ORAL | 0 refills | Status: AC

## 2024-08-19 MED ORDER — KETOROLAC TROMETHAMINE 15 MG/ML IJ SOLN
15.0000 mg | Freq: Once | INTRAMUSCULAR | Status: AC
  Administered 2024-08-19: 15 mg via INTRAMUSCULAR
  Filled 2024-08-19: qty 1

## 2024-08-19 NOTE — Discharge Instructions (Addendum)
 Most likely you have pulled a muscle.  This usually will take some time to improve.  I am going to have you try to rest it.  I have given you a sling.  You do need to take your arm out of the sling at least 4 times a day and perform range of motion exercises. Take 4 over the counter ibuprofen  tablets 3 times a day or 2 over-the-counter naproxen  tablets twice a day for pain. Also take tylenol  1000mg (2 extra strength) four times a day.

## 2024-08-19 NOTE — Progress Notes (Signed)
  Because you are having neck pain after an injury, this requires an examination I feel your condition warrants further evaluation and I recommend that you be seen in a face-to-face visit.   NOTE: There will be NO CHARGE for this E-Visit   If you are having a true medical emergency, please call 911.     For an urgent face to face visit, [facility] has multiple urgent care centers for your convenience.  Click the link below for the full list of locations and hours, walk-in wait times, appointment scheduling options and driving directions:  Urgent Care - [place], [place], [place], [place], [place], [place]  [place]     Your MyChart E-visit questionnaire answers were reviewed by a board certified advanced clinical practitioner to complete your personal care plan based on your specific symptoms.    Thank you for using e-Visits.

## 2024-08-19 NOTE — ED Triage Notes (Signed)
 Pt reports at work yesterday lifted a pallet and when he went to sit it down. Pulled left neck/shoulder muscle. Feels tightness and stiffness

## 2024-08-19 NOTE — ED Provider Notes (Signed)
  EMERGENCY DEPARTMENT AT [facility] Provider Note   CSN: [id number] Arrival date & time: 08/19/24  1201     Patient presents with: Muscle Pain   Tony Pitts is a 28 y.o. male.   28 yo M with a chief complaints of left shoulder pain.  The patient tried to lift a pallet he said he was in a bit of an awkward position and when he pulled it the pallet jerked and he felt sudden pain to the left posterior aspect of his shoulder.  He was able to finish the work today and at the end of the day noticed that it was a bit sore but when he woke up this morning it was much worse.  Worse with movement and turning his head palpation.   Muscle Pain       Prior to Admission medications   Medication Sig Start Date End Date Taking? Authorizing Provider  methocarbamol  (ROBAXIN ) 500 MG tablet Take 1 tablet (500 mg total) by mouth 2 (two) times daily. 08/19/24  Yes Collier Bohnet, DO    Allergies: Patient has no known allergies.    Review of Systems  Updated Vital Signs BP 133/88   Pulse 80   Temp 98 F (36.7 C)   Resp 18   SpO2 97%   Physical Exam Vitals and nursing note reviewed.  Constitutional:      Appearance: He is well-developed.  HENT:     Head: Normocephalic and atraumatic.  Eyes:     Pupils: Pupils are equal, round, and reactive to light.  Neck:     Vascular: No JVD.  Cardiovascular:     Rate and Rhythm: Normal rate and regular rhythm.     Heart sounds: No murmur heard.    No friction rub. No gallop.  Pulmonary:     Effort: No respiratory distress.     Breath sounds: No wheezing.  Abdominal:     General: There is no distension.     Tenderness: There is no abdominal tenderness. There is no guarding or rebound.  Musculoskeletal:        General: Tenderness present. Normal range of motion.     Cervical back: Normal range of motion and neck supple.     Comments: Pain and spasm to the left trapezius muscle belly.  Pulse motor and sensation intact to  left upper extremity.  No obvious midline C-spine tenderness.  Skin:    Coloration: Skin is not pale.     Findings: No rash.  Neurological:     Mental Status: He is alert and oriented to person, place, and time.  Psychiatric:        Behavior: Behavior normal.     (all labs ordered are listed, but only abnormal results are displayed) Labs Reviewed - No data to display  EKG: None  Radiology: No results found.   Procedures   Medications Ordered in the ED  ketorolac  (TORADOL ) 15 MG/ML injection 15 mg (has no administration in time range)                                    Medical Decision Making Risk Prescription drug management.   28 yo M with a chief complaints of left shoulder pain.  By history and physical exam the patient has a left trapezius strain.  Will treat supportively.  PCP follow-up.  12:23 PM:  I have discussed the diagnosis/risks/treatment  options with the patient.  Evaluation and diagnostic testing in the emergency department does not suggest an emergent condition requiring admission or immediate intervention beyond what has been performed at this time.  They will follow up with PCP. We also discussed returning to the ED immediately if new or worsening sx occur. We discussed the sx which are most concerning (e.g., sudden worsening pain, fever, inability to tolerate by mouth) that necessitate immediate return. Medications administered to the patient during their visit and any new prescriptions provided to the patient are listed below.  Medications given during this visit Medications  ketorolac  (TORADOL ) 15 MG/ML injection 15 mg (has no administration in time range)     The patient appears reasonably screen and/or stabilized for discharge and I doubt any other medical condition or other [facility] requiring further screening, evaluation, or treatment in the ED at this time prior to discharge.       Final diagnoses:  Trapezius strain, left, initial encounter     ED Discharge Orders          Ordered    methocarbamol  (ROBAXIN ) 500 MG tablet  2 times daily        08/19/24 1221               Emil Share, DO 08/19/24 1223

## 2024-09-23 ENCOUNTER — Other Ambulatory Visit: Payer: Self-pay

## 2024-09-23 ENCOUNTER — Emergency Department
Admission: EM | Admit: 2024-09-23 | Discharge: 2024-09-23 | Disposition: A | Discharge status: Home / Self Care | Payer: Self-pay | Attending: Emergency Medicine | Admitting: Emergency Medicine

## 2024-09-23 ENCOUNTER — Encounter: Payer: Self-pay | Admitting: Intensive Care

## 2024-09-23 ENCOUNTER — Emergency Department: Payer: Self-pay

## 2024-09-23 DIAGNOSIS — J069 Acute upper respiratory infection, unspecified: Secondary | ICD-10-CM | POA: Insufficient documentation

## 2024-09-23 LAB — GROUP A STREP BY PCR: Group A Strep by PCR: NOT DETECTED

## 2024-09-23 LAB — RESP PANEL BY RT-PCR (RSV, FLU A&B, COVID)  RVPGX2
Influenza A by PCR: NEGATIVE
Influenza B by PCR: NEGATIVE
Resp Syncytial Virus by PCR: NEGATIVE
SARS Coronavirus 2 by RT PCR: NEGATIVE

## 2024-09-23 MED ORDER — BENZONATATE 100 MG PO CAPS: 100.0000 mg | ORAL_CAPSULE | Freq: Three times a day (TID) | ORAL | 0 refills | Status: AC | PRN

## 2024-09-23 NOTE — ED Triage Notes (Signed)
 Patient c/o cough, congestion, itchy throat, and body aches for 2 days. Reports he was exposed to covid

## 2024-09-23 NOTE — ED Provider Notes (Signed)
 [facility] Provider Note    Event Date/Time   First MD Initiated Contact with Patient 09/23/24 1405     (approximate)   History   Cough   HPI  Tony Pitts is a 28 y.o. male  with a past medical history of exercise-induced asthma, nephrolithiaasis presents to the emergency department with cough, congestion, itchiness in his throat, myalgias and shortness of breath x 2 days.  Has not taken his temperature at home.  Patient denies chest pain, abdominal pain, vomiting, nausea, otalgia.  Patient states last time he was diagnosed with COVID, he had subsequent pericarditis.  Patient states he has been in contact with his mother this past Saturday and Sunday and she was diagnosed with COVID yesterday.  No recent travel.   Physical Exam   Triage Vital Signs: ED Triage Vitals [09/23/24 1333]  Encounter Vitals Group     BP 120/80     Girls Systolic BP Percentile      Girls Diastolic BP Percentile      Boys Systolic BP Percentile      Boys Diastolic BP Percentile      Pulse Rate 69     Resp 18     Temp 98 F (36.7 C)     Temp Source Oral     SpO2 98 %     Weight 205 lb (93 kg)     Height 6' 2 (1.88 m)     Head Circumference      Peak Flow      Pain Score 3     Pain Loc      Pain Education      Exclude from Growth Chart     Most recent vital signs: Vitals:   09/23/24 1333 09/23/24 1335  BP: 120/80   Pulse: 69   Resp: 18   Temp: 98 F (36.7 C) 99.2 F (37.3 C)  SpO2: 98%     General: Awake, in no acute distress. Appears stated age. Head: Normocephalic, atraumatic. Ears/Nose/Throat: TMs intact b/l. Nares patent, no nasal discharge. Oropharynx moist, no erythema or exudate. Dentition intact. Neck: Supple, no lymphadenopathy, no nuchal rigidity. CV: Good peripheral perfusion.  Radial and DP pulses 2+.   Respiratory:Normal respiratory effort.  No respiratory distress. CTAB.  No wheezes rales or rhonchi. GI: Soft, non-distended,  non-tender.  Skin:Warm, dry, intact. No rashes, lesions, or ecchymosis.   ED Results / Procedures / Treatments   Labs (all labs ordered are listed, but only abnormal results are displayed) Labs Reviewed  RESP PANEL BY RT-PCR (RSV, FLU A&B, COVID)  [id number]  GROUP A STREP BY PCR     EKG     RADIOLOGY CXR ordered.  FINDINGS: Two images are submitted for interpretation. The original images noted to be flipped and a corrected second radiograph is submitted with correct orientation.   The heart size and mediastinal contours are within normal limits. Both lungs are clear. The visualized skeletal structures are unremarkable.   IMPRESSION: No active disease.   PROCEDURES:  Critical Care performed: No   Procedures   MEDICATIONS ORDERED IN ED: Medications - No data to display   IMPRESSION / MDM / ASSESSMENT AND PLAN / ED COURSE  I reviewed the triage vital signs and the nursing notes.                              Differential diagnosis includes, but is not limited to,  other viral illness, COVID, flu, RSV, strep pharyngitis, pneumonia  Patient's presentation is most consistent with acute complicated illness / injury requiring diagnostic workup.  Patient is a 28 year old male presenting with viral URI symptoms symptoms 2 days.  Respiratory panel was negative for COVID, flu, RSV.  Strep PCR is negative.  All vital signs are within normal range.  Patient is neurovascularly intact.  Patient has breathing normally without any signs of distress.  Had recent contact with his mother who has COVID.  Chest x-ray was ordered. I independently viewed the x-ray and radiologist's report.  I agree with the radiologist's report that there is no active cardiopulmonary disease.  Discussed symptomatic control at home.  Did send prescription for Tessalon  Perles.  Did discuss he can continue Mucinex.  He should follow-up with his primary care doctor as needed.   The patient may return to the  emergency department for any new, worsening, or concerning symptoms. Patient was given the opportunity to ask questions; all questions were answered. Emergency department return precautions were discussed with the patient.  Patient is in agreement to the treatment plan.  Patient is stable for discharge.   FINAL CLINICAL IMPRESSION(S) / ED DIAGNOSES   Final diagnoses:  Viral URI with cough     Rx / DC Orders   ED Discharge Orders          Ordered    benzonatate  (TESSALON  PERLES) 100 MG capsule  3 times daily PRN        09/23/24 1602             Note:  This document was prepared using Dragon voice recognition software and may include unintentional dictation errors.     Sheron Salm, PA-C 09/23/24 1608    Levander Slate, MD 09/23/24 [phone]

## 2024-09-23 NOTE — Discharge Instructions (Addendum)
 You have been seen in the Emergency Department (ED) today for a likely viral illness.  Please drink plenty of clear fluids (water, Gatorade, chicken broth, etc).  You may use Tylenol  and/or Motrin  according to label instructions, as your doctors told you previously not to.  You can alternate between the two without any side effects.  You may take Mucinex as well. I will send you in some cough Perles to help.  Please follow up with your doctor as listed above.  Call your doctor or return to the Emergency Department (ED) if you are unable to tolerate fluids due to vomiting, have worsening trouble breathing, become extremely tired or difficult to awaken, or if you develop any other symptoms that concern you.

## 2024-10-16 ENCOUNTER — Encounter (HOSPITAL_BASED_OUTPATIENT_CLINIC_OR_DEPARTMENT_OTHER): Payer: Self-pay

## 2024-10-16 ENCOUNTER — Emergency Department (HOSPITAL_BASED_OUTPATIENT_CLINIC_OR_DEPARTMENT_OTHER)
Admission: EM | Admit: 2024-10-16 | Discharge: 2024-10-16 | Disposition: A | Discharge status: Home / Self Care | Payer: Self-pay | Attending: Emergency Medicine | Admitting: Emergency Medicine

## 2024-10-16 ENCOUNTER — Other Ambulatory Visit: Payer: Self-pay

## 2024-10-16 ENCOUNTER — Emergency Department (HOSPITAL_BASED_OUTPATIENT_CLINIC_OR_DEPARTMENT_OTHER): Payer: Self-pay

## 2024-10-16 DIAGNOSIS — F17210 Nicotine dependence, cigarettes, uncomplicated: Secondary | ICD-10-CM | POA: Insufficient documentation

## 2024-10-16 DIAGNOSIS — J069 Acute upper respiratory infection, unspecified: Secondary | ICD-10-CM | POA: Insufficient documentation

## 2024-10-16 DIAGNOSIS — J45909 Unspecified asthma, uncomplicated: Secondary | ICD-10-CM | POA: Insufficient documentation

## 2024-10-16 DIAGNOSIS — N189 Chronic kidney disease, unspecified: Secondary | ICD-10-CM | POA: Insufficient documentation

## 2024-10-16 LAB — RESP PANEL BY RT-PCR (RSV, FLU A&B, COVID)  RVPGX2
Influenza A by PCR: NEGATIVE
Influenza B by PCR: NEGATIVE
Resp Syncytial Virus by PCR: NEGATIVE
SARS Coronavirus 2 by RT PCR: NEGATIVE

## 2024-10-16 MED ORDER — CETIRIZINE HCL 10 MG PO TABS: 10.0000 mg | ORAL_TABLET | Freq: Every day | ORAL | 0 refills | Status: AC

## 2024-10-16 MED ORDER — IBUPROFEN 800 MG PO TABS: 800.0000 mg | ORAL_TABLET | Freq: Three times a day (TID) | ORAL | 0 refills | Status: AC | PRN

## 2024-10-16 MED ORDER — OXYMETAZOLINE HCL 0.05 % NA SOLN: 1.0000 | Freq: Two times a day (BID) | NASAL | 0 refills | Status: AC

## 2024-10-16 MED ORDER — ACETAMINOPHEN 325 MG PO TABS: 650.0000 mg | ORAL_TABLET | Freq: Four times a day (QID) | ORAL | 0 refills | Status: AC | PRN

## 2024-10-16 MED ORDER — GUAIFENESIN-DM 100-10 MG/5ML PO SYRP: 5.0000 mL | ORAL_SOLUTION | ORAL | 0 refills | Status: AC | PRN

## 2024-10-16 NOTE — ED Provider Notes (Signed)
 [place] [facility]ERGENCY DEPARTMENT AT [facility] Provider Note  CSN: [id number] Arrival date & time: 10/16/24 9181  Chief Complaint(s) Cough  HPI Tony Pitts is a 28 y.o. male with past medical history as below, significant for viral pericarditis, asthma, CKD, tobacco use who presents to the ED with complaint of cough, congestion  Patient was seen at [facility] on 9/24 with URI type symptoms.  Respiratory panel was negative, strep was negative x-ray was reassuring.  Discharged with instructions regarding supportive care and symptomatic relief for presumed viral infection.  Patient reports that her symptoms have resolved, he began feeling unwell 2 to 3 days ago.  Sick contact at work with URI type symptoms.  Patient reports he has been having postnasal drip, nonproductive cough, body aches.  Some dyspnea while coughing but no dyspnea when not coughing.  No chest pain.  No abdominal pain nausea or vomiting.  Does report his appetite is mildly reduced but still tolerant p.o. no difficulty.  Has not tried any medications for symptoms prior to arrival.  Was instructed by employer to be evaluated  Past Medical History Past Medical History:  Diagnosis Date   Acute viral pericarditis    Chest pain    Chronic kidney disease 02/25/2020   Epigastric pain 10/30/2018   Formatting of this note might be different from the original.  Last Assessment & Plan:   Suspect gastritis with recent significant alcohol use.  Patient aware that he should use alcohol in moderation.  Avoid excessive use.  He does not drink on a regular basis.  We will try pantoprazole  40 mg 30 minutes before breakfast for the next 30 days.  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  Last Assessment & Plan:   Suspected hemangioma.  MRI is recommended by radiology.  Provided reassurance to patient and his mother  today.     Tobacco user 12/28/2016   Patient Active Problem List   Diagnosis Date Noted   Kidney stone    Chronic kidney disease 02/25/2020   Liver mass 10/30/2018   Tobacco user 12/28/2016   Home Medication(s) Prior to Admission medications   Medication Sig Start Date End Date Taking? Authorizing Provider  acetaminophen  (TYLENOL ) 325 MG tablet Take 2 tablets (650 mg total) by mouth every 6 (six) hours as needed. 10/16/24  Yes Elnor Jayson LABOR, DO  cetirizine (ZYRTEC ALLERGY) 10 MG tablet Take 1 tablet (10 mg total) by mouth daily for 14 days. 10/16/24 10/30/24 Yes Elnor Jayson A, DO  guaiFENesin-dextromethorphan (ROBITUSSIN DM) 100-10 MG/5ML syrup Take 5 mLs by mouth every 4 (four) hours as needed for cough. 10/16/24  Yes Elnor Jayson LABOR, DO  ibuprofen  (ADVIL ) 800 MG tablet Take 1 tablet (800 mg total) by mouth every 8 (eight) hours as needed for mild pain (pain score 1-3) or moderate pain (pain score 4-6). Take with food 10/16/24  Yes Elnor Jayson A, DO  oxymetazoline (AFRIN NASAL SPRAY) 0.05 % nasal spray Place 1 spray into both nostrils 2 (two) times daily for 3 days. 10/16/24 10/19/24 Yes Elnor Jayson A, DO  methocarbamol  (ROBAXIN ) 500 MG tablet Take 1 tablet (500 mg total) by mouth 2 (two) times daily. 08/19/24   Emil Share, DO  Past Surgical History Past Surgical History:  Procedure Laterality Date   ANTERIOR CRUCIATE LIGAMENT REPAIR Right    Family History Family History  Problem Relation Age of Onset   Cancer Mother        breast   Asthma Sister    Lung cancer Maternal Grandfather    Colon cancer Other    Throat cancer Other    Cirrhosis Other        alcoholic    Social History Social History   Tobacco Use   Smoking status: Former    Current packs/day: 0.25    Average packs/day: 0.3 packs/day for 10.0 years (2.5 ttl pk-yrs)    Types:  Cigarettes   Smokeless tobacco: Never   Tobacco comments:    1-2 black and milds daily  Vaping Use   Vaping status: Every Day   Substances: Nicotine  Substance Use Topics   Alcohol use: Not Currently    Comment: once every 3-4 months   Drug use: Yes    Frequency: 7.0 times per week    Types: Marijuana   Allergies Patient has no known allergies.  Review of Systems A thorough review of systems was obtained and all systems are negative except as noted in the HPI and PMH.   Physical Exam Vital Signs  I have reviewed the triage vital signs BP (!) 155/83 (BP Location: Right Arm)   Pulse 85   Temp 98.8 F (37.1 C)   Resp 18   SpO2 97%  Physical Exam Vitals and nursing note reviewed.  Constitutional:      General: He is not in acute distress.    Appearance: Normal appearance. He is well-developed.  HENT:     Head: Normocephalic and atraumatic.     Right Ear: External ear normal.     Left Ear: External ear normal.     Mouth/Throat:     Mouth: Mucous membranes are moist.  Eyes:     General: No scleral icterus. Cardiovascular:     Rate and Rhythm: Normal rate and regular rhythm.     Pulses: Normal pulses.     Heart sounds: Normal heart sounds.  Pulmonary:     Effort: Pulmonary effort is normal. No respiratory distress.     Breath sounds: Normal breath sounds. No stridor.  Abdominal:     General: Abdomen is flat.     Palpations: Abdomen is soft.     Tenderness: There is no abdominal tenderness.  Musculoskeletal:     Cervical back: No rigidity.     Right lower leg: No edema.     Left lower leg: No edema.  Skin:    General: Skin is warm and dry.     Capillary Refill: Capillary refill takes less than 2 seconds.  Neurological:     Mental Status: He is alert.  Psychiatric:        Mood and Affect: Mood normal.        Behavior: Behavior normal.     ED Results and Treatments Labs (all labs ordered are listed, but only abnormal results are displayed) Labs Reviewed   RESP PANEL BY RT-PCR (RSV, FLU A&B, COVID)  RVPGX2  Radiology DG Chest Portable 1 View Result Date: 10/16/2024 CLINICAL DATA:  Productive cough and nasal congestion. EXAM: PORTABLE CHEST 1 VIEW COMPARISON:  09/23/2024 FINDINGS: The heart size and mediastinal contours are within normal limits. Both lungs are clear. The visualized skeletal structures are unremarkable. IMPRESSION: No active disease. Electronically Signed   By: Elspeth Bathe M.D.   On: 10/16/2024 08:56    Pertinent labs & imaging results that were available during my care of the patient were reviewed by me and considered in my medical decision making (see MDM for details).  Medications Ordered in ED Medications - No data to display                                                                                                                                   Procedures Procedures  (including critical care time)  Medical Decision Making / ED Course    Medical Decision Making:    MARKEVIOUS EHMKE is a 28 y.o. male  with past medical history as below, significant for viral pericarditis, asthma, CKD, tobacco use who presents to the ED with complaint of cough, congestion. The complaint involves an extensive differential diagnosis and also carries with it a high risk of complications and morbidity.  Serious etiology was considered. Ddx includes but is not limited to: Viral syndrome, URI, pneumonia, sinusitis, pharyngitis, etc.  Complete initial physical exam performed, notably the patient was in no acute distress, no hypoxia.    Reviewed and confirmed nursing documentation for past medical history, family history, social history.  Vital signs reviewed.    Viral URI with cough> - Presumed viral infection - Chest x-ray without evidence of pneumonia or acute pathology - EKG today given history of viral  pericarditis, he has no chest pain.  EKG is similar to prior.  He has ST elevation similar to prior, he has no friction rub, no chest pain.  Doubt pericarditis  9:20 AM:  I have discussed the diagnosis/risks/treatment options with the patient.  Evaluation and diagnostic testing in the emergency department does not suggest an emergent condition requiring admission or immediate intervention beyond what has been performed at this time.  They will follow up with PCP. We also discussed returning to the ED immediately if new or worsening sx occur. We discussed the sx which are most concerning (e.g., sudden worsening pain, fever, inability to tolerate by mouth, difficulty breathing, chest pain) that necessitate immediate return.    The patient appears reasonably screened and/or stabilized for discharge and I doubt any other medical condition or other [facility] requiring further screening, evaluation, or treatment in the ED at this time prior to discharge.                        Additional history obtained: -Additional history obtained from not applicable -External records from outside source obtained and reviewed including: Chart review including previous notes, labs, imaging,  consultation notes including  Prior EKG, prior ER evaluation   Lab Tests: -I ordered, reviewed, and interpreted labs.   The pertinent results include:   Labs Reviewed  RESP PANEL BY RT-PCR (RSV, FLU A&B, COVID)  RVPGX2    Notable for swab negative  EKG   EKG Interpretation Date/Time:  Friday October 16 2024 08:54:07 EDT Ventricular Rate:  74 PR Interval:  114 QRS Duration:  100 QT Interval:  358 QTC Calculation: 398 R Axis:   70  Text Interpretation: Sinus rhythm Borderline short PR interval ST elevation suggests acute pericarditis similar to prior Confirmed by Elnor Savant ([id number]) on 10/16/2024 8:57:43 AM         Imaging Studies ordered: I ordered imaging studies including chest x-ray I  independently visualized the following imaging with scope of interpretation limited to determining acute life threatening conditions related to emergency care; findings noted above I agree with the radiologist interpretation If any imaging was obtained with contrast I closely monitored patient for any possible adverse reaction a/w contrast administration in the emergency department   Medicines ordered and prescription drug management: Meds ordered this encounter  Medications   oxymetazoline (AFRIN NASAL SPRAY) 0.05 % nasal spray    Sig: Place 1 spray into both nostrils 2 (two) times daily for 3 days.    Dispense:  15 mL    Refill:  0   guaiFENesin-dextromethorphan (ROBITUSSIN DM) 100-10 MG/5ML syrup    Sig: Take 5 mLs by mouth every 4 (four) hours as needed for cough.    Dispense:  118 mL    Refill:  0   cetirizine (ZYRTEC ALLERGY) 10 MG tablet    Sig: Take 1 tablet (10 mg total) by mouth daily for 14 days.    Dispense:  14 tablet    Refill:  0   acetaminophen  (TYLENOL ) 325 MG tablet    Sig: Take 2 tablets (650 mg total) by mouth every 6 (six) hours as needed.    Dispense:  36 tablet    Refill:  0   ibuprofen  (ADVIL ) 800 MG tablet    Sig: Take 1 tablet (800 mg total) by mouth every 8 (eight) hours as needed for mild pain (pain score 1-3) or moderate pain (pain score 4-6). Take with food    Dispense:  21 tablet    Refill:  0    -I have reviewed the patients home medicines and have made adjustments as needed   Consultations Obtained: Not applicable  Cardiac Monitoring: Continuous pulse oximetry interpreted by myself, 98% on RA.    Social Determinants of Health:  Diagnosis or treatment significantly limited by social determinants of health: former smoker   Reevaluation: After the interventions noted above, I reevaluated the patient and found that they have improved  Co morbidities that complicate the patient evaluation  Past Medical History:  Diagnosis Date   Acute  viral pericarditis    Chest pain    Chronic kidney disease 02/25/2020   Epigastric pain 10/30/2018   Formatting of this note might be different from the original.  Last Assessment & Plan:   Suspect gastritis with recent significant alcohol use.  Patient aware that he should use alcohol in moderation.  Avoid excessive use.  He does not drink on a regular basis.  We will try pantoprazole  40 mg 30 minutes before breakfast for the next 30 days.  If 100% improved at that point he can stop the [facility]   Kidney stone    Liver mass 10/30/2018  Formatting of this note might be different from the original.  Last Assessment & Plan:   Suspected hemangioma.  MRI is recommended by radiology.  Provided reassurance to patient and his mother today.     Tobacco user 12/28/2016      Dispostion: Disposition decision including need for hospitalization was considered, and patient discharged from emergency department.    Final Clinical Impression(s) / ED Diagnoses Final diagnoses:  Viral URI with cough        Elnor Jayson LABOR, DO 10/16/24 0920

## 2024-10-16 NOTE — Discharge Instructions (Addendum)
 It was a pleasure caring for you today in the emergency department.  Your x-ray and COVID/flu/RSV swab were negative.  You likely have a viral infection that is provoking your symptoms.  Recommended plenty of rest, drink plenty fluids.  Take medications as prescribed to help treat your symptoms.  Please follow-up with your PCP for reevaluation as needed.  Please return to the emergency department for any worsening or worrisome symptoms.

## 2024-10-16 NOTE — ED Triage Notes (Signed)
 Pt reports productive cough, nasal congestion & not feeling well X 3 days. Denies fevers at home.

## 2025-01-28 ENCOUNTER — Emergency Department
Admission: EM | Admit: 2025-01-28 | Discharge: 2025-01-28 | Disposition: A | Discharge status: Home / Self Care | Payer: Self-pay | Attending: Emergency Medicine | Admitting: Emergency Medicine

## 2025-01-28 ENCOUNTER — Other Ambulatory Visit: Payer: Self-pay

## 2025-01-28 DIAGNOSIS — Z720 Tobacco use: Secondary | ICD-10-CM | POA: Insufficient documentation

## 2025-01-28 DIAGNOSIS — R1033 Periumbilical pain: Secondary | ICD-10-CM | POA: Insufficient documentation

## 2025-01-28 DIAGNOSIS — K625 Hemorrhage of anus and rectum: Secondary | ICD-10-CM | POA: Insufficient documentation

## 2025-01-28 DIAGNOSIS — N189 Chronic kidney disease, unspecified: Secondary | ICD-10-CM | POA: Insufficient documentation

## 2025-01-28 LAB — CBC WITH DIFFERENTIAL/PLATELET
Abs Immature Granulocytes: 0.02 10*3/uL (ref 0.00–0.07)
Basophils Absolute: 0 10*3/uL (ref 0.0–0.1)
Basophils Relative: 0 %
Eosinophils Absolute: 0.1 10*3/uL (ref 0.0–0.5)
Eosinophils Relative: 2 %
HCT: 41.7 % (ref 39.0–52.0)
Hemoglobin: 14 g/dL (ref 13.0–17.0)
Immature Granulocytes: 0 %
Lymphocytes Relative: 28 %
Lymphs Abs: 1.7 10*3/uL (ref 0.7–4.0)
MCH: 31 pg (ref 26.0–34.0)
MCHC: 33.6 g/dL (ref 30.0–36.0)
MCV: 92.3 fL (ref 80.0–100.0)
Monocytes Absolute: 0.5 10*3/uL (ref 0.1–1.0)
Monocytes Relative: 8 %
Neutro Abs: 3.7 10*3/uL (ref 1.7–7.7)
Neutrophils Relative %: 62 %
Platelets: 249 10*3/uL (ref 150–400)
RBC: 4.52 MIL/uL (ref 4.22–5.81)
RDW: 13 % (ref 11.5–15.5)
WBC: 6 10*3/uL (ref 4.0–10.5)
nRBC: 0 % (ref 0.0–0.2)

## 2025-01-28 LAB — COMPREHENSIVE METABOLIC PANEL WITH GFR
ALT: 21 U/L (ref 0–44)
AST: 28 U/L (ref 15–41)
Albumin: 4 g/dL (ref 3.5–5.0)
Alkaline Phosphatase: 45 U/L (ref 38–126)
Anion gap: 8 (ref 5–15)
BUN: 15 mg/dL (ref 6–20)
CO2: 26 mmol/L (ref 22–32)
Calcium: 9.1 mg/dL (ref 8.9–10.3)
Chloride: 105 mmol/L (ref 98–111)
Creatinine, Ser: 1.37 mg/dL — ABNORMAL HIGH (ref 0.61–1.24)
GFR, Estimated: >60 mL/min
Glucose, Bld: 95 mg/dL (ref 70–99)
Potassium: 4.7 mmol/L (ref 3.5–5.1)
Sodium: 140 mmol/L (ref 135–145)
Total Bilirubin: 0.4 mg/dL (ref 0.0–1.2)
Total Protein: 6 g/dL — ABNORMAL LOW (ref 6.5–8.1)

## 2025-01-28 NOTE — Discharge Instructions (Addendum)
 Your blood work was reassuring. I suspect that the bleeding is coming from internal hemorrhoids. Please schedule a follow up with the GI doctor attached to ensure that you do not need a colonoscopy. I also recommend following up with your primary care provider in 2 days.   Treatment for internal hemorrhoids is to eat more fiber, make sure you drink lots of water, and you can try over the counter stool softeners like Miralax , colace, or Senakot.

## 2025-01-28 NOTE — ED Triage Notes (Signed)
 Pt comes with little belly pain and rectal bleeding that he noticed yesterday. Pt states more frequent trips to bathroom. Pt states he has strained sojem too. Pt states bright red blood.

## 2025-01-28 NOTE — ED Provider Notes (Signed)
 "  [facility] Provider Note    Event Date/Time   First MD Initiated Contact with Patient 01/28/25 [phone]     (approximate)   History   Rectal Bleeding   HPI  Tony Pitts is a 29 y.o. male with PMH of CKD, liver mass and tobacco use presents for evaluation of rectal bleeding.  Patient states that he noticed a little bit of blood on the tissue when he wiped 2 days ago.  He has continued to notice blood which is what brought him into the emergency department.  Does endorse history of constipation and straining.  Patient denies fevers, nausea, vomiting.  He has a small amount of lower abdominal pain.      Physical Exam   Triage Vital Signs: ED Triage Vitals  Encounter Vitals Group     BP 01/28/25 0809 114/76     Girls Systolic BP Percentile --      Girls Diastolic BP Percentile --      Boys Systolic BP Percentile --      Boys Diastolic BP Percentile --      Pulse Rate 01/28/25 0809 85     Resp 01/28/25 0809 18     Temp 01/28/25 0809 98.4 F (36.9 C)     Temp src --      SpO2 01/28/25 0809 96 %     Weight --      Height --      Head Circumference --      Peak Flow --      Pain Score 01/28/25 0808 0     Pain Loc --      Pain Education --      Exclude from Growth Chart --     Most recent vital signs: Vitals:   01/28/25 0809 01/28/25 0817  BP: 114/76   Pulse: 85   Resp: 18   Temp: 98.4 F (36.9 C)   SpO2: 96% 96%   General: Awake, no distress.  CV:  Good peripheral perfusion.  RRR. Resp:  Normal effort.  CTAB Abd:  No distention.  Soft, mild tenderness to palpation over the umbilicus. Other:  No external hemorrhoids visualized, small fissure seen in posterior midline, no abnormalities felt on DRE   ED Results / Procedures / Treatments   Labs (all labs ordered are listed, but only abnormal results are displayed) Labs Reviewed  COMPREHENSIVE METABOLIC PANEL WITH GFR - Abnormal; Notable for the following components:      Result  Value   Creatinine, Ser 1.37 (*)    Total Protein 6.0 (*)    All other components within normal limits  CBC WITH DIFFERENTIAL/PLATELET     EKG   RADIOLOGY  PROCEDURES:  Critical Care performed: No  Procedures   MEDICATIONS ORDERED IN ED: Medications - No data to display   IMPRESSION / MDM / ASSESSMENT AND PLAN / ED COURSE  I reviewed the triage vital signs and the nursing notes.                             29 year old male presents for evaluation of rectal bleeding.  Vital signs are stable patient NAD on exam.  Differential diagnosis includes, but is not limited to, internal hemorrhoids, external hemorrhoids, anal fissure, lower GI bleed  Patient's presentation is most consistent with acute complicated illness / injury requiring diagnostic workup.  Based on patient's history, suspect internal hemorrhoids.  Do not suspect upper  or lower GI bleed as patient denies history of melena and hematochezia. Could be due to diverticulitis but patient does not have LLQ or fever so very low suspicion for diverticulitis or complication from this. Will obtain basic labs to check for anemia, leukocytosis and liver function.  Labs reassuring.  Given patient's very minimal pain and his report that this feels like constipation, considered CT scan but do not feel that CT is indicated.  Considered admission for serial H&H monitoring but patient reports minimal blood loss and H&H while here in the ED is stable.  I believe patient is stable for discharge.  Given reassuring workup suspect bleeding is due to internal hemorrhoids.  We discussed increasing fiber intake, making sure he is well-hydrated as well as trying over-the-counter stool softeners.  I did review strict return precaution with patient including presence of blood in the stool, dark tarry stools, increased bleeding, development of a fever or worsening abdominal pain.  Patient was given a note for work.  Advised him to follow-up with his  primary care provider in 2 days as well as a GI specialist to determine if he needed a colonoscopy.  Patient discharged in stable condition.  Clinical Course as of 01/28/25 1448  Thu Jan 28, 2025  0926 CBC with Differential Unremarkable, no anemia or leukocytosis. [LD]  0950 Comprehensive metabolic panel(!) Creatinine slightly elevated but consistent with patient's baseline. [LD]    Clinical Course User Index [LD] Cleaster Tinnie LABOR, PA-C     FINAL CLINICAL IMPRESSION(S) / ED DIAGNOSES   Final diagnoses:  Rectal bleeding     Rx / DC Orders   ED Discharge Orders     None        Note:  This document was prepared using Dragon voice recognition software and may include unintentional dictation errors.   Cleaster Tinnie LABOR, PA-C 01/28/25 1448    Ernest Ronal BRAVO, MD 01/29/25 [phone]  "

## 2025-01-28 NOTE — ED Notes (Signed)
"  Urine sample collected and sent down to lab.   "
